# Patient Record
Sex: Female | Born: 1940 | Marital: Married | Smoking: Former smoker
Health system: Southern US, Community
[De-identification: ages and names within clinical notes are randomized; demographics above are authoritative.]

## PROBLEM LIST (undated history)

## (undated) DIAGNOSIS — K224 Dyskinesia of esophagus: Secondary | ICD-10-CM

## (undated) DIAGNOSIS — R7881 Bacteremia: Secondary | ICD-10-CM

## (undated) DIAGNOSIS — J189 Pneumonia, unspecified organism: Secondary | ICD-10-CM

## (undated) DIAGNOSIS — D649 Anemia, unspecified: Secondary | ICD-10-CM

## (undated) DIAGNOSIS — E119 Type 2 diabetes mellitus without complications: Secondary | ICD-10-CM

## (undated) DIAGNOSIS — T17908A Unspecified foreign body in respiratory tract, part unspecified causing other injury, initial encounter: Secondary | ICD-10-CM

## (undated) DIAGNOSIS — G8929 Other chronic pain: Secondary | ICD-10-CM

## (undated) DIAGNOSIS — J449 Chronic obstructive pulmonary disease, unspecified: Secondary | ICD-10-CM

## (undated) DIAGNOSIS — J9611 Chronic respiratory failure with hypoxia: Secondary | ICD-10-CM

## (undated) DIAGNOSIS — N179 Acute kidney failure, unspecified: Secondary | ICD-10-CM

## (undated) DIAGNOSIS — I10 Essential (primary) hypertension: Secondary | ICD-10-CM

---

## 2006-04-12 ENCOUNTER — Ambulatory Visit: Payer: Self-pay | Admitting: Family Medicine

## 2006-08-27 ENCOUNTER — Emergency Department: Payer: Self-pay | Admitting: Emergency Medicine

## 2007-05-24 ENCOUNTER — Inpatient Hospital Stay: Payer: Self-pay | Admitting: Internal Medicine

## 2007-05-24 ENCOUNTER — Other Ambulatory Visit: Payer: Self-pay

## 2008-08-02 ENCOUNTER — Emergency Department: Payer: Self-pay | Admitting: Emergency Medicine

## 2008-08-02 ENCOUNTER — Other Ambulatory Visit: Payer: Self-pay

## 2009-06-25 ENCOUNTER — Emergency Department: Payer: Self-pay | Admitting: Unknown Physician Specialty

## 2009-07-17 ENCOUNTER — Emergency Department: Payer: Self-pay | Admitting: Emergency Medicine

## 2009-08-01 DEATH — deceased

## 2009-09-27 ENCOUNTER — Emergency Department: Payer: Self-pay | Admitting: Internal Medicine

## 2010-02-07 ENCOUNTER — Emergency Department: Payer: Self-pay | Admitting: Emergency Medicine

## 2010-07-27 ENCOUNTER — Ambulatory Visit: Payer: Self-pay | Admitting: Family Medicine

## 2010-09-29 ENCOUNTER — Ambulatory Visit: Payer: Self-pay | Admitting: Orthopedic Surgery

## 2010-09-30 ENCOUNTER — Ambulatory Visit: Payer: Self-pay | Admitting: Orthopedic Surgery

## 2010-10-04 ENCOUNTER — Emergency Department: Payer: Self-pay | Admitting: Emergency Medicine

## 2010-10-24 ENCOUNTER — Emergency Department: Payer: Self-pay | Admitting: Emergency Medicine

## 2011-02-17 ENCOUNTER — Ambulatory Visit: Payer: Self-pay | Admitting: Family Medicine

## 2012-04-02 ENCOUNTER — Inpatient Hospital Stay: Payer: Self-pay | Admitting: Internal Medicine

## 2012-04-02 LAB — COMPREHENSIVE METABOLIC PANEL
Albumin: 4.8 g/dL (ref 3.4–5.0)
Anion Gap: 9 (ref 7–16)
BUN: 11 mg/dL (ref 7–18)
Calcium, Total: 10.3 mg/dL — ABNORMAL HIGH (ref 8.5–10.1)
Chloride: 101 mmol/L (ref 98–107)
Co2: 31 mmol/L (ref 21–32)
Creatinine: 0.89 mg/dL (ref 0.60–1.30)
EGFR (African American): >60
Glucose: 99 mg/dL (ref 65–99)
Osmolality: 281 (ref 275–301)
Potassium: 4.6 mmol/L (ref 3.5–5.1)
SGOT(AST): 31 U/L (ref 15–37)
Sodium: 141 mmol/L (ref 136–145)

## 2012-04-02 LAB — CBC WITH DIFFERENTIAL/PLATELET
Basophil #: 0.1 10*3/uL (ref 0.0–0.1)
Basophil %: 0.7 %
Eosinophil #: 0.6 10*3/uL (ref 0.0–0.7)
Eosinophil %: 4.5 %
HCT: 48.8 % — ABNORMAL HIGH (ref 35.0–47.0)
HGB: 17.1 g/dL — ABNORMAL HIGH (ref 12.0–16.0)
Lymphocyte #: 3.9 10*3/uL — ABNORMAL HIGH (ref 1.0–3.6)
Lymphocyte %: 29.6 %
MCH: 30.9 pg (ref 26.0–34.0)
MCHC: 35.1 g/dL (ref 32.0–36.0)
MCV: 88 fL (ref 80–100)
Monocyte #: 0.4 x10 3/mm (ref 0.2–0.9)
Monocyte %: 3.2 %
Neutrophil #: 8.1 10*3/uL — ABNORMAL HIGH (ref 1.4–6.5)
Neutrophil %: 62 %
Platelet: 236 10*3/uL (ref 150–440)
RBC: 5.54 10*6/uL — ABNORMAL HIGH (ref 3.80–5.20)
RDW: 13.3 % (ref 11.5–14.5)
WBC: 13 10*3/uL — ABNORMAL HIGH (ref 3.6–11.0)

## 2012-04-03 LAB — CBC WITH DIFFERENTIAL/PLATELET
Basophil #: 0 10*3/uL (ref 0.0–0.1)
Basophil %: 0.7 %
HCT: 39.9 % (ref 35.0–47.0)
HGB: 13.4 g/dL (ref 12.0–16.0)
Lymphocyte %: 38.5 %
MCH: 30 pg (ref 26.0–34.0)
MCHC: 33.7 g/dL (ref 32.0–36.0)
MCV: 89 fL (ref 80–100)
Monocyte #: 0.6 x10 3/mm (ref 0.2–0.9)
Neutrophil %: 46 %
RBC: 4.48 10*6/uL (ref 3.80–5.20)
WBC: 7.4 10*3/uL (ref 3.6–11.0)

## 2012-04-07 LAB — CULTURE, BLOOD (SINGLE)

## 2012-05-02 ENCOUNTER — Encounter: Payer: Self-pay | Admitting: Orthopedic Surgery

## 2012-06-01 ENCOUNTER — Encounter: Payer: Self-pay | Admitting: Orthopedic Surgery

## 2012-08-15 ENCOUNTER — Ambulatory Visit: Payer: Self-pay | Admitting: Family Medicine

## 2012-11-12 ENCOUNTER — Emergency Department: Payer: Self-pay | Admitting: Emergency Medicine

## 2012-11-12 LAB — URINALYSIS, COMPLETE
Blood: NEGATIVE
Glucose,UR: NEGATIVE mg/dL (ref 0–75)
Ketone: NEGATIVE
Nitrite: NEGATIVE
Ph: 6 (ref 4.5–8.0)
RBC,UR: 1 /HPF (ref 0–5)
Specific Gravity: 1.013 (ref 1.003–1.030)
WBC UR: 3 /HPF (ref 0–5)

## 2012-11-12 LAB — CBC WITH DIFFERENTIAL/PLATELET
Basophil #: 0.6 10*3/uL — ABNORMAL HIGH (ref 0.0–0.1)
Basophil %: 6 %
HCT: 38.5 % (ref 35.0–47.0)
HGB: 13.2 g/dL (ref 12.0–16.0)
Lymphocyte %: 9.9 %
MCV: 88 fL (ref 80–100)
Monocyte %: 2.6 %
Platelet: 226 10*3/uL (ref 150–440)
RBC: 4.39 10*6/uL (ref 3.80–5.20)

## 2012-11-12 LAB — COMPREHENSIVE METABOLIC PANEL
Albumin: 3.5 g/dL (ref 3.4–5.0)
Alkaline Phosphatase: 125 U/L (ref 50–136)
Anion Gap: 6 — ABNORMAL LOW (ref 7–16)
BUN: 14 mg/dL (ref 7–18)
Bilirubin,Total: 0.6 mg/dL (ref 0.2–1.0)
Chloride: 103 mmol/L (ref 98–107)
Co2: 31 mmol/L (ref 21–32)
Creatinine: 1.04 mg/dL (ref 0.60–1.30)
EGFR (African American): >60
EGFR (Non-African Amer.): 54 — ABNORMAL LOW
Osmolality: 280 (ref 275–301)
Potassium: 3.8 mmol/L (ref 3.5–5.1)
SGOT(AST): 22 U/L (ref 15–37)
Total Protein: 7.2 g/dL (ref 6.4–8.2)

## 2012-11-12 LAB — RAPID INFLUENZA A&B ANTIGENS

## 2012-12-14 LAB — CBC WITH DIFFERENTIAL/PLATELET
Basophil %: 0.6 %
Eosinophil #: 0.3 10*3/uL (ref 0.0–0.7)
Eosinophil %: 4.4 %
HCT: 40.4 % (ref 35.0–47.0)
Lymphocyte #: 1.4 10*3/uL (ref 1.0–3.6)
Lymphocyte %: 18.4 %
MCH: 30.4 pg (ref 26.0–34.0)
MCV: 89 fL (ref 80–100)
Monocyte %: 13.9 %
Neutrophil #: 4.7 10*3/uL (ref 1.4–6.5)
Platelet: 182 10*3/uL (ref 150–440)
RBC: 4.56 10*6/uL (ref 3.80–5.20)
RDW: 12.9 % (ref 11.5–14.5)
WBC: 7.5 10*3/uL (ref 3.6–11.0)

## 2012-12-14 LAB — COMPREHENSIVE METABOLIC PANEL
Albumin: 3.9 g/dL (ref 3.4–5.0)
Alkaline Phosphatase: 114 U/L (ref 50–136)
BUN: 18 mg/dL (ref 7–18)
Bilirubin,Total: 0.8 mg/dL (ref 0.2–1.0)
Calcium, Total: 9 mg/dL (ref 8.5–10.1)
Co2: 31 mmol/L (ref 21–32)
EGFR (African American): 38 — ABNORMAL LOW
Glucose: 114 mg/dL — ABNORMAL HIGH (ref 65–99)
Osmolality: 273 (ref 275–301)
SGOT(AST): 28 U/L (ref 15–37)
SGPT (ALT): 26 U/L (ref 12–78)
Sodium: 135 mmol/L — ABNORMAL LOW (ref 136–145)
Total Protein: 8 g/dL (ref 6.4–8.2)

## 2012-12-14 LAB — URINALYSIS, COMPLETE
Bilirubin,UR: NEGATIVE
Blood: NEGATIVE
Glucose,UR: NEGATIVE mg/dL (ref 0–75)
Hyaline Cast: 24
Ketone: NEGATIVE
Nitrite: NEGATIVE
Specific Gravity: 1.02 (ref 1.003–1.030)

## 2012-12-14 LAB — TROPONIN I: Troponin-I: <0.02 ng/mL

## 2012-12-14 LAB — TSH: Thyroid Stimulating Horm: 2.56 u[IU]/mL

## 2012-12-15 ENCOUNTER — Inpatient Hospital Stay: Payer: Self-pay | Admitting: Student

## 2012-12-16 LAB — CBC WITH DIFFERENTIAL/PLATELET
Basophil #: 0 10*3/uL (ref 0.0–0.1)
Basophil %: 0.2 %
Eosinophil #: 0 10*3/uL (ref 0.0–0.7)
HCT: 39.9 % (ref 35.0–47.0)
HGB: 13.8 g/dL (ref 12.0–16.0)
Lymphocyte %: 10.5 %
MCH: 30.4 pg (ref 26.0–34.0)
MCV: 88 fL (ref 80–100)
Monocyte #: 0.4 x10 3/mm (ref 0.2–0.9)
Neutrophil #: 12 10*3/uL — ABNORMAL HIGH (ref 1.4–6.5)
Neutrophil %: 86.3 %
Platelet: 201 10*3/uL (ref 150–440)
RBC: 4.55 10*6/uL (ref 3.80–5.20)
RDW: 12.6 % (ref 11.5–14.5)

## 2012-12-16 LAB — BASIC METABOLIC PANEL
BUN: 13 mg/dL (ref 7–18)
Co2: 26 mmol/L (ref 21–32)
Creatinine: 1.06 mg/dL (ref 0.60–1.30)
EGFR (Non-African Amer.): 53 — ABNORMAL LOW
Glucose: 276 mg/dL — ABNORMAL HIGH (ref 65–99)
Osmolality: 286 (ref 275–301)
Potassium: 3.9 mmol/L (ref 3.5–5.1)

## 2012-12-17 LAB — URINE CULTURE

## 2012-12-20 LAB — CBC WITH DIFFERENTIAL/PLATELET
Basophil #: 0 10*3/uL (ref 0.0–0.1)
Basophil %: 0.4 %
Eosinophil #: 0 10*3/uL (ref 0.0–0.7)
Eosinophil %: 0 %
HGB: 13.7 g/dL (ref 12.0–16.0)
MCH: 30.2 pg (ref 26.0–34.0)
MCHC: 34.7 g/dL (ref 32.0–36.0)
MCV: 87 fL (ref 80–100)
Monocyte #: 0.6 x10 3/mm (ref 0.2–0.9)
Neutrophil #: 8.8 10*3/uL — ABNORMAL HIGH (ref 1.4–6.5)
Neutrophil %: 80 %
Platelet: 222 10*3/uL (ref 150–440)
WBC: 11 10*3/uL (ref 3.6–11.0)

## 2012-12-20 LAB — EXPECTORATED SPUTUM ASSESSMENT W GRAM STAIN, RFLX TO RESP C

## 2012-12-20 LAB — CULTURE, BLOOD (SINGLE)

## 2012-12-27 LAB — BASIC METABOLIC PANEL
Anion Gap: 11 (ref 7–16)
BUN: 26 mg/dL — ABNORMAL HIGH (ref 7–18)
Chloride: 92 mmol/L — ABNORMAL LOW (ref 98–107)
EGFR (African American): >60
EGFR (Non-African Amer.): >60
Glucose: 431 mg/dL — ABNORMAL HIGH (ref 65–99)
Potassium: 4.9 mmol/L (ref 3.5–5.1)

## 2012-12-27 LAB — PLATELET COUNT: Platelet: 266 10*3/uL (ref 150–440)

## 2012-12-28 LAB — BASIC METABOLIC PANEL
Anion Gap: 6 — ABNORMAL LOW (ref 7–16)
BUN: 24 mg/dL — ABNORMAL HIGH (ref 7–18)
Calcium, Total: 8.7 mg/dL (ref 8.5–10.1)
Chloride: 92 mmol/L — ABNORMAL LOW (ref 98–107)
Co2: 30 mmol/L (ref 21–32)
Creatinine: 0.77 mg/dL (ref 0.60–1.30)
EGFR (African American): >60
Osmolality: 280 (ref 275–301)
Potassium: 4.9 mmol/L (ref 3.5–5.1)
Sodium: 128 mmol/L — ABNORMAL LOW (ref 136–145)

## 2012-12-29 LAB — BASIC METABOLIC PANEL
Anion Gap: 8 (ref 7–16)
Creatinine: 0.77 mg/dL (ref 0.60–1.30)
EGFR (African American): >60
Glucose: 299 mg/dL — ABNORMAL HIGH (ref 65–99)
Osmolality: 277 (ref 275–301)
Potassium: 4.4 mmol/L (ref 3.5–5.1)

## 2012-12-30 ENCOUNTER — Ambulatory Visit: Payer: Self-pay | Admitting: Internal Medicine

## 2013-01-07 ENCOUNTER — Inpatient Hospital Stay: Payer: Self-pay | Admitting: Internal Medicine

## 2013-01-07 LAB — CBC WITH DIFFERENTIAL/PLATELET
Basophil %: 1.1 %
Eosinophil #: 0.2 10*3/uL (ref 0.0–0.7)
Eosinophil %: 1.2 %
HCT: 32 % — ABNORMAL LOW (ref 35.0–47.0)
HGB: 10.6 g/dL — ABNORMAL LOW (ref 12.0–16.0)
Lymphocyte %: 3.7 %
MCH: 29 pg (ref 26.0–34.0)
MCHC: 33 g/dL (ref 32.0–36.0)
MCV: 88 fL (ref 80–100)
Monocyte %: 3.3 %
Neutrophil %: 90.7 %
WBC: 12.9 10*3/uL — ABNORMAL HIGH (ref 3.6–11.0)

## 2013-01-07 LAB — COMPREHENSIVE METABOLIC PANEL
Alkaline Phosphatase: 112 U/L (ref 50–136)
Anion Gap: 10 (ref 7–16)
Calcium, Total: 8.2 mg/dL — ABNORMAL LOW (ref 8.5–10.1)
Creatinine: 0.73 mg/dL (ref 0.60–1.30)
EGFR (African American): >60
EGFR (Non-African Amer.): >60
Osmolality: 266 (ref 275–301)
Potassium: 3.2 mmol/L — ABNORMAL LOW (ref 3.5–5.1)
SGOT(AST): 17 U/L (ref 15–37)
SGPT (ALT): 29 U/L (ref 12–78)
Sodium: 132 mmol/L — ABNORMAL LOW (ref 136–145)
Total Protein: 6.3 g/dL — ABNORMAL LOW (ref 6.4–8.2)

## 2013-01-07 LAB — CK-MB: CK-MB: 0.9 ng/mL (ref 0.5–3.6)

## 2013-01-07 LAB — CK TOTAL AND CKMB (NOT AT ARMC): CK-MB: 3.5 ng/mL (ref 0.5–3.6)

## 2013-01-07 LAB — TROPONIN I: Troponin-I: 0.12 ng/mL — ABNORMAL HIGH

## 2013-01-08 DIAGNOSIS — R079 Chest pain, unspecified: Secondary | ICD-10-CM

## 2013-01-08 LAB — CBC WITH DIFFERENTIAL/PLATELET
Basophil #: 0 x10 3/mm 3 (ref 0.0–0.1)
Basophil %: 0.3 %
Eosinophil #: 0 x10 3/mm 3 (ref 0.0–0.7)
Eosinophil %: 0 %
HCT: 26 % — ABNORMAL LOW (ref 35.0–47.0)
HGB: 8.7 g/dL — ABNORMAL LOW (ref 12.0–16.0)
Lymphocyte %: 3.5 %
Lymphs Abs: 0.4 x10 3/mm 3 — ABNORMAL LOW (ref 1.0–3.6)
MCH: 29.9 pg (ref 26.0–34.0)
MCHC: 33.5 g/dL (ref 32.0–36.0)
MCV: 89 fL (ref 80–100)
Monocyte #: 0.2 x10 3/mm (ref 0.2–0.9)
Monocyte %: 1.6 %
Neutrophil #: 10.7 x10 3/mm 3 — ABNORMAL HIGH (ref 1.4–6.5)
Neutrophil %: 94.6 %
Platelet: 158 x10 3/mm 3 (ref 150–440)
RBC: 2.92 X10 6/mm 3 — ABNORMAL LOW (ref 3.80–5.20)
RDW: 13.3 % (ref 11.5–14.5)
WBC: 11.3 x10 3/mm 3 — ABNORMAL HIGH (ref 3.6–11.0)

## 2013-01-08 LAB — BASIC METABOLIC PANEL WITH GFR
Anion Gap: 10 (ref 7–16)
BUN: 7 mg/dL (ref 7–18)
Calcium, Total: 7.8 mg/dL — ABNORMAL LOW (ref 8.5–10.1)
Chloride: 98 mmol/L (ref 98–107)
Co2: 25 mmol/L (ref 21–32)
Creatinine: 0.88 mg/dL (ref 0.60–1.30)
EGFR (African American): >60
EGFR (Non-African Amer.): >60
Glucose: 267 mg/dL — ABNORMAL HIGH (ref 65–99)
Osmolality: 274 (ref 275–301)
Potassium: 2.8 mmol/L — ABNORMAL LOW (ref 3.5–5.1)
Sodium: 133 mmol/L — ABNORMAL LOW (ref 136–145)

## 2013-01-08 LAB — MAGNESIUM
Magnesium: 1.2 mg/dL — ABNORMAL LOW
Magnesium: 2 mg/dL

## 2013-01-08 LAB — POTASSIUM: Potassium: 4.1 mmol/L (ref 3.5–5.1)

## 2013-01-08 LAB — TROPONIN I
Troponin-I: 0.03 ng/mL
Troponin-I: 0.48 ng/mL — ABNORMAL HIGH

## 2013-01-08 LAB — CK TOTAL AND CKMB (NOT AT ARMC): CK, Total: 477 U/L — ABNORMAL HIGH (ref 21–215)

## 2013-01-09 LAB — CBC WITH DIFFERENTIAL/PLATELET
Basophil #: 0 10*3/uL (ref 0.0–0.1)
Eosinophil #: 0 10*3/uL (ref 0.0–0.7)
HGB: 8.9 g/dL — ABNORMAL LOW (ref 12.0–16.0)
Lymphocyte %: 3.5 %
MCH: 30.7 pg (ref 26.0–34.0)
MCV: 89 fL (ref 80–100)
Monocyte #: 0.3 x10 3/mm (ref 0.2–0.9)
Monocyte %: 2.6 %
Neutrophil #: 9.3 10*3/uL — ABNORMAL HIGH (ref 1.4–6.5)
Platelet: 200 10*3/uL (ref 150–440)
RBC: 2.91 10*6/uL — ABNORMAL LOW (ref 3.80–5.20)
WBC: 10 10*3/uL (ref 3.6–11.0)

## 2013-01-09 LAB — BASIC METABOLIC PANEL
Anion Gap: 3 — ABNORMAL LOW (ref 7–16)
BUN: 9 mg/dL (ref 7–18)
Calcium, Total: 8.3 mg/dL — ABNORMAL LOW (ref 8.5–10.1)
Chloride: 106 mmol/L (ref 98–107)
Co2: 29 mmol/L (ref 21–32)
Creatinine: 0.57 mg/dL — ABNORMAL LOW (ref 0.60–1.30)
EGFR (African American): >60
EGFR (Non-African Amer.): >60
Potassium: 3.9 mmol/L (ref 3.5–5.1)
Sodium: 138 mmol/L (ref 136–145)

## 2013-01-09 LAB — TROPONIN I: Troponin-I: 0.64 ng/mL — ABNORMAL HIGH

## 2013-01-09 LAB — VANCOMYCIN, TROUGH: Vancomycin, Trough: 7 ug/mL — ABNORMAL LOW (ref 10–20)

## 2013-01-09 LAB — MAGNESIUM: Magnesium: 1.8 mg/dL

## 2013-01-09 LAB — T4, FREE: Free Thyroxine: 1.46 ng/dL (ref 0.76–1.46)

## 2013-01-10 DIAGNOSIS — I499 Cardiac arrhythmia, unspecified: Secondary | ICD-10-CM

## 2013-01-12 LAB — CULTURE, BLOOD (SINGLE)

## 2013-01-14 LAB — PLATELET COUNT: Platelet: 434 10*3/uL (ref 150–440)

## 2013-01-15 LAB — CBC WITH DIFFERENTIAL/PLATELET
Bands: 1 %
HGB: 10.4 g/dL — ABNORMAL LOW (ref 12.0–16.0)
Lymphocytes: 13 %
MCH: 29.8 pg (ref 26.0–34.0)
MCV: 89 fL (ref 80–100)
Metamyelocyte: 1 %
Monocytes: 1 %
RDW: 13.6 % (ref 11.5–14.5)
Segmented Neutrophils: 84 %
WBC: 8.1 10*3/uL (ref 3.6–11.0)

## 2013-01-16 ENCOUNTER — Inpatient Hospital Stay
Admission: AD | Admit: 2013-01-16 | Discharge: 2013-04-01 | Disposition: E | Payer: No Typology Code available for payment source | Source: Ambulatory Visit | Attending: Internal Medicine | Admitting: Internal Medicine

## 2013-01-16 DIAGNOSIS — A499 Bacterial infection, unspecified: Secondary | ICD-10-CM

## 2013-01-16 DIAGNOSIS — J90 Pleural effusion, not elsewhere classified: Secondary | ICD-10-CM

## 2013-01-16 DIAGNOSIS — J9600 Acute respiratory failure, unspecified whether with hypoxia or hypercapnia: Secondary | ICD-10-CM | POA: Diagnosis present

## 2013-01-16 DIAGNOSIS — J449 Chronic obstructive pulmonary disease, unspecified: Secondary | ICD-10-CM | POA: Diagnosis present

## 2013-01-16 DIAGNOSIS — A498 Other bacterial infections of unspecified site: Secondary | ICD-10-CM | POA: Diagnosis not present

## 2013-01-16 DIAGNOSIS — R6521 Severe sepsis with septic shock: Secondary | ICD-10-CM | POA: Diagnosis present

## 2013-01-16 DIAGNOSIS — J95851 Ventilator associated pneumonia: Secondary | ICD-10-CM

## 2013-01-16 DIAGNOSIS — R0902 Hypoxemia: Secondary | ICD-10-CM | POA: Diagnosis present

## 2013-01-16 DIAGNOSIS — R1310 Dysphagia, unspecified: Secondary | ICD-10-CM | POA: Diagnosis present

## 2013-01-16 DIAGNOSIS — B3749 Other urogenital candidiasis: Secondary | ICD-10-CM

## 2013-01-16 DIAGNOSIS — J810 Acute pulmonary edema: Secondary | ICD-10-CM | POA: Diagnosis present

## 2013-01-16 DIAGNOSIS — A4159 Other Gram-negative sepsis: Secondary | ICD-10-CM | POA: Diagnosis not present

## 2013-01-16 DIAGNOSIS — A419 Sepsis, unspecified organism: Secondary | ICD-10-CM | POA: Diagnosis present

## 2013-01-16 HISTORY — DX: Chronic obstructive pulmonary disease, unspecified: J44.9

## 2013-01-16 HISTORY — DX: Unspecified foreign body in respiratory tract, part unspecified causing other injury, initial encounter: T17.908A

## 2013-01-16 HISTORY — DX: Bacteremia: R78.81

## 2013-01-16 HISTORY — DX: Chronic respiratory failure with hypoxia: J96.11

## 2013-01-16 HISTORY — DX: Acute kidney failure, unspecified: N17.9

## 2013-01-16 HISTORY — DX: Other chronic pain: G89.29

## 2013-01-16 HISTORY — DX: Dyskinesia of esophagus: K22.4

## 2013-01-16 HISTORY — DX: Type 2 diabetes mellitus without complications: E11.9

## 2013-01-16 HISTORY — DX: Anemia, unspecified: D64.9

## 2013-01-16 HISTORY — DX: Pneumonia, unspecified organism: J18.9

## 2013-01-16 HISTORY — DX: Essential (primary) hypertension: I10

## 2013-01-17 ENCOUNTER — Other Ambulatory Visit (HOSPITAL_COMMUNITY): Payer: Self-pay

## 2013-01-17 LAB — COMPREHENSIVE METABOLIC PANEL
Albumin: 2.6 g/dL — ABNORMAL LOW (ref 3.5–5.2)
BUN: 20 mg/dL (ref 6–23)
Calcium: 8.5 mg/dL (ref 8.4–10.5)
Creatinine, Ser: 0.5 mg/dL (ref 0.50–1.10)
GFR calc Af Amer: >90 mL/min (ref >90)
Glucose, Bld: 130 mg/dL — ABNORMAL HIGH (ref 70–99)
Potassium: 3.7 mEq/L (ref 3.5–5.1)
Total Protein: 5.1 g/dL — ABNORMAL LOW (ref 6.0–8.3)

## 2013-01-17 LAB — PRO B NATRIURETIC PEPTIDE: Pro B Natriuretic peptide (BNP): 513.6 pg/mL — ABNORMAL HIGH (ref 0–125)

## 2013-01-17 LAB — PROCALCITONIN: Procalcitonin: <0.1 ng/mL

## 2013-01-17 LAB — CBC
HCT: 29.2 % — ABNORMAL LOW (ref 36.0–46.0)
Hemoglobin: 10.2 g/dL — ABNORMAL LOW (ref 12.0–15.0)
MCH: 30.4 pg (ref 26.0–34.0)
MCHC: 34.9 g/dL (ref 30.0–36.0)
RDW: 14.4 % (ref 11.5–15.5)

## 2013-01-17 LAB — PREALBUMIN: Prealbumin: 32.9 mg/dL (ref 17.0–34.0)

## 2013-01-17 LAB — SEDIMENTATION RATE: Sed Rate: 17 mm/hr (ref 0–22)

## 2013-01-18 LAB — BASIC METABOLIC PANEL
BUN: 18 mg/dL (ref 6–23)
Chloride: 97 mEq/L (ref 96–112)
GFR calc Af Amer: >90 mL/min (ref >90)
GFR calc non Af Amer: >90 mL/min (ref >90)
Potassium: 4 mEq/L (ref 3.5–5.1)

## 2013-01-18 LAB — CBC
HCT: 28.9 % — ABNORMAL LOW (ref 36.0–46.0)
MCHC: 36.3 g/dL — ABNORMAL HIGH (ref 30.0–36.0)
Platelets: 396 10*3/uL (ref 150–400)
RDW: 14.8 % (ref 11.5–15.5)
WBC: 11.7 10*3/uL — ABNORMAL HIGH (ref 4.0–10.5)

## 2013-01-19 ENCOUNTER — Other Ambulatory Visit (HOSPITAL_COMMUNITY): Payer: No Typology Code available for payment source

## 2013-01-20 LAB — CBC
MCV: 86 fL (ref 78.0–100.0)
Platelets: 331 10*3/uL (ref 150–400)
RBC: 3.44 MIL/uL — ABNORMAL LOW (ref 3.87–5.11)
WBC: 10.8 10*3/uL — ABNORMAL HIGH (ref 4.0–10.5)

## 2013-01-20 LAB — RENAL FUNCTION PANEL
CO2: 26 mEq/L (ref 19–32)
Chloride: 95 mEq/L — ABNORMAL LOW (ref 96–112)
Creatinine, Ser: 0.52 mg/dL (ref 0.50–1.10)
GFR calc Af Amer: >90 mL/min (ref >90)
GFR calc non Af Amer: >90 mL/min (ref >90)
Potassium: 4.2 mEq/L (ref 3.5–5.1)
Sodium: 134 mEq/L — ABNORMAL LOW (ref 135–145)

## 2013-01-22 ENCOUNTER — Other Ambulatory Visit (HOSPITAL_COMMUNITY): Payer: Self-pay

## 2013-01-22 LAB — CBC WITH DIFFERENTIAL/PLATELET
Eosinophils Absolute: 0 10*3/uL (ref 0.0–0.7)
Eosinophils Relative: 0 % (ref 0–5)
Hemoglobin: 10.9 g/dL — ABNORMAL LOW (ref 12.0–15.0)
Lymphocytes Relative: 6 % — ABNORMAL LOW (ref 12–46)
Lymphs Abs: 0.6 10*3/uL — ABNORMAL LOW (ref 0.7–4.0)
MCH: 30.8 pg (ref 26.0–34.0)
MCV: 89.3 fL (ref 78.0–100.0)
Monocytes Relative: 5 % (ref 3–12)
Neutrophils Relative %: 89 % — ABNORMAL HIGH (ref 43–77)
RBC: 3.54 MIL/uL — ABNORMAL LOW (ref 3.87–5.11)
WBC: 10.6 10*3/uL — ABNORMAL HIGH (ref 4.0–10.5)

## 2013-01-22 LAB — BASIC METABOLIC PANEL
CO2: 30 mEq/L (ref 19–32)
Glucose, Bld: 120 mg/dL — ABNORMAL HIGH (ref 70–99)
Potassium: 3.8 mEq/L (ref 3.5–5.1)
Sodium: 137 mEq/L (ref 135–145)

## 2013-01-22 LAB — CBC
Hemoglobin: 10.6 g/dL — ABNORMAL LOW (ref 12.0–15.0)
Platelets: 254 10*3/uL (ref 150–400)
RBC: 3.42 MIL/uL — ABNORMAL LOW (ref 3.87–5.11)
WBC: 13.8 10*3/uL — ABNORMAL HIGH (ref 4.0–10.5)

## 2013-01-23 ENCOUNTER — Other Ambulatory Visit (HOSPITAL_COMMUNITY): Payer: Self-pay

## 2013-01-23 LAB — EXPECTORATED SPUTUM ASSESSMENT W GRAM STAIN, RFLX TO RESP C: Special Requests: NORMAL

## 2013-01-24 ENCOUNTER — Other Ambulatory Visit (HOSPITAL_COMMUNITY): Payer: Self-pay

## 2013-01-24 LAB — BASIC METABOLIC PANEL
BUN: 12 mg/dL (ref 6–23)
Calcium: 9.1 mg/dL (ref 8.4–10.5)
Creatinine, Ser: 0.45 mg/dL — ABNORMAL LOW (ref 0.50–1.10)
GFR calc Af Amer: >90 mL/min (ref >90)
GFR calc non Af Amer: >90 mL/min (ref >90)
Glucose, Bld: 103 mg/dL — ABNORMAL HIGH (ref 70–99)
Potassium: 3.7 mEq/L (ref 3.5–5.1)

## 2013-01-24 LAB — CBC WITH DIFFERENTIAL/PLATELET
Basophils Relative: 0 % (ref 0–1)
Eosinophils Absolute: 0 10*3/uL (ref 0.0–0.7)
Eosinophils Relative: 0 % (ref 0–5)
Hemoglobin: 11.2 g/dL — ABNORMAL LOW (ref 12.0–15.0)
Lymphs Abs: 1.4 10*3/uL (ref 0.7–4.0)
MCH: 30.9 pg (ref 26.0–34.0)
MCHC: 34 g/dL (ref 30.0–36.0)
MCV: 90.9 fL (ref 78.0–100.0)
Monocytes Absolute: 0.7 10*3/uL (ref 0.1–1.0)
Monocytes Relative: 7 % (ref 3–12)
Neutrophils Relative %: 79 % — ABNORMAL HIGH (ref 43–77)
RBC: 3.62 MIL/uL — ABNORMAL LOW (ref 3.87–5.11)

## 2013-01-25 LAB — CULTURE, RESPIRATORY W GRAM STAIN

## 2013-01-25 LAB — CBC WITH DIFFERENTIAL/PLATELET
Basophils Absolute: 0 10*3/uL (ref 0.0–0.1)
Basophils Relative: 0 % (ref 0–1)
Eosinophils Absolute: 0 10*3/uL (ref 0.0–0.7)
Hemoglobin: 10.5 g/dL — ABNORMAL LOW (ref 12.0–15.0)
MCH: 30.8 pg (ref 26.0–34.0)
MCHC: 34.7 g/dL (ref 30.0–36.0)
Monocytes Relative: 9 % (ref 3–12)
Neutro Abs: 4.8 10*3/uL (ref 1.7–7.7)
Neutrophils Relative %: 72 % (ref 43–77)
RDW: 16.1 % — ABNORMAL HIGH (ref 11.5–15.5)

## 2013-01-25 LAB — BASIC METABOLIC PANEL
BUN: 13 mg/dL (ref 6–23)
Chloride: 99 mEq/L (ref 96–112)
Creatinine, Ser: 0.47 mg/dL — ABNORMAL LOW (ref 0.50–1.10)
GFR calc Af Amer: >90 mL/min (ref >90)
GFR calc non Af Amer: >90 mL/min (ref >90)
Potassium: 3.9 mEq/L (ref 3.5–5.1)

## 2013-01-25 NOTE — H&P (Signed)
Elizabeth Maddox is an 72 y.o. female.   Chief Complaint: Dysphagia Using tube feeds now Deconditioning Admitted for pain control (spinal stenosis) and COPD Scheduled for percutaneous gastric tube placement for nutrition HPI: COPD; DM; CAD; HTN; spinal stenosis; dysphagia  No past medical history on file.  No past surgical history on file.  No family history on file. Social History:  has no tobacco, alcohol, and drug history on file.  Allergies: Not on File  No prescriptions prior to admission    Results for orders placed during the hospital encounter of 01/23/2013 (from the past 48 hour(s))  CULTURE, EXPECTORATED SPUTUM-ASSESSMENT     Status: None   Collection Time    01/23/13 10:22 AM      Result Value Range   Specimen Description SPUTUM     Special Requests Normal     Sputum evaluation       Value: THIS SPECIMEN IS ACCEPTABLE. RESPIRATORY CULTURE REPORT TO FOLLOW.   Report Status 01/23/2013 FINAL    CULTURE, RESPIRATORY (NON-EXPECTORATED)     Status: None   Collection Time    01/23/13 10:22 AM      Result Value Range   Specimen Description SPUTUM     Special Requests NONE     Gram Stain       Value: FEW WBC PRESENT,BOTH PMN AND MONONUCLEAR     FEW SQUAMOUS EPITHELIAL CELLS PRESENT     MODERATE GRAM NEGATIVE RODS   Culture ABUNDANT ESCHERICHIA COLI     Report Status 01/25/2013 FINAL     Organism ID, Bacteria ESCHERICHIA COLI    CBC WITH DIFFERENTIAL     Status: Abnormal   Collection Time    01/24/13 11:30 AM      Result Value Range   WBC 10.1  4.0 - 10.5 K/uL   RBC 3.62 (*) 3.87 - 5.11 MIL/uL   Hemoglobin 11.2 (*) 12.0 - 15.0 g/dL   HCT 14.7 (*) 82.9 - 56.2 %   MCV 90.9  78.0 - 100.0 fL   MCH 30.9  26.0 - 34.0 pg   MCHC 34.0  30.0 - 36.0 g/dL   RDW 13.0 (*) 86.5 - 78.4 %   Platelets 190  150 - 400 K/uL   Neutrophils Relative 79 (*) 43 - 77 %   Neutro Abs 7.9 (*) 1.7 - 7.7 K/uL   Lymphocytes Relative 14  12 - 46 %   Lymphs Abs 1.4  0.7 - 4.0 K/uL   Monocytes Relative 7  3 - 12 %   Monocytes Absolute 0.7  0.1 - 1.0 K/uL   Eosinophils Relative 0  0 - 5 %   Eosinophils Absolute 0.0  0.0 - 0.7 K/uL   Basophils Relative 0  0 - 1 %   Basophils Absolute 0.0  0.0 - 0.1 K/uL  BASIC METABOLIC PANEL     Status: Abnormal   Collection Time    01/24/13 11:30 AM      Result Value Range   Sodium 142  135 - 145 mEq/L   Potassium 3.7  3.5 - 5.1 mEq/L   Chloride 102  96 - 112 mEq/L   CO2 31  19 - 32 mEq/L   Glucose, Bld 103 (*) 70 - 99 mg/dL   BUN 12  6 - 23 mg/dL   Creatinine, Ser 6.96 (*) 0.50 - 1.10 mg/dL   Calcium 9.1  8.4 - 29.5 mg/dL   GFR calc non Af Amer >90  >90 mL/min   GFR calc  Af Amer >90  >90 mL/min   Comment:            The eGFR has been calculated     using the CKD EPI equation.     This calculation has not been     validated in all clinical     situations.     eGFR's persistently     <90 mL/min signify     possible Chronic Kidney Disease.  CBC WITH DIFFERENTIAL     Status: Abnormal   Collection Time    01/25/13  5:40 AM      Result Value Range   WBC 6.6  4.0 - 10.5 K/uL   RBC 3.41 (*) 3.87 - 5.11 MIL/uL   Hemoglobin 10.5 (*) 12.0 - 15.0 g/dL   HCT 28.4 (*) 13.2 - 44.0 %   MCV 88.9  78.0 - 100.0 fL   MCH 30.8  26.0 - 34.0 pg   MCHC 34.7  30.0 - 36.0 g/dL   RDW 10.2 (*) 72.5 - 36.6 %   Platelets 156  150 - 400 K/uL   Neutrophils Relative 72  43 - 77 %   Neutro Abs 4.8  1.7 - 7.7 K/uL   Lymphocytes Relative 18  12 - 46 %   Lymphs Abs 1.2  0.7 - 4.0 K/uL   Monocytes Relative 9  3 - 12 %   Monocytes Absolute 0.6  0.1 - 1.0 K/uL   Eosinophils Relative 0  0 - 5 %   Eosinophils Absolute 0.0  0.0 - 0.7 K/uL   Basophils Relative 0  0 - 1 %   Basophils Absolute 0.0  0.0 - 0.1 K/uL  BASIC METABOLIC PANEL     Status: Abnormal   Collection Time    01/25/13  5:40 AM      Result Value Range   Sodium 138  135 - 145 mEq/L   Potassium 3.9  3.5 - 5.1 mEq/L   Chloride 99  96 - 112 mEq/L   CO2 32  19 - 32 mEq/L   Glucose, Bld  164 (*) 70 - 99 mg/dL   BUN 13  6 - 23 mg/dL   Creatinine, Ser 4.40 (*) 0.50 - 1.10 mg/dL   Calcium 8.9  8.4 - 34.7 mg/dL   GFR calc non Af Amer >90  >90 mL/min   GFR calc Af Amer >90  >90 mL/min   Comment:            The eGFR has been calculated     using the CKD EPI equation.     This calculation has not been     validated in all clinical     situations.     eGFR's persistently     <90 mL/min signify     possible Chronic Kidney Disease.   Dg Chest Port 1 View  01/24/2013  *RADIOLOGY REPORT*  Clinical Data: PICC placement.  PORTABLE CHEST - 1 VIEW  Comparison: 01/22/2013.  Findings: Right PICC tip projects over the SVC.  Nasogastric tube is followed into the stomach with the tip projecting beyond the inferior boundary of the image.  Lungs are somewhat low in volume with slight improvement in bibasilar air space disease.  No definite pleural fluid.  IMPRESSION: Slight improvement in bibasilar air space disease.   Original Report Authenticated By: Leanna Battles, M.D.    Dg Abd Portable 1v  01/23/2013  *RADIOLOGY REPORT*  Clinical Data: Feeding tube position  PORTABLE ABDOMEN - 1 VIEW  Comparison: Prior films same day  Findings: There is a NG feeding tube with tip in the proximal stomach.  Degenerative changes lumbar spine.  Dextroscoliosis lumbar spine.  Significant colonic stool.  IMPRESSION: Feeding tube with tip in proximal stomach.   Original Report Authenticated By: Natasha Mead, M.D.    Dg Abd Portable 1v  01/23/2013  *RADIOLOGY REPORT*  Clinical Data: NG tube placement  PORTABLE ABDOMEN - 1 VIEW  Comparison: Chest radiographs dated 01/22/2013  Findings: Patchy left lower lobe opacity, suspicious for pneumonia. Mild patchy right lower lobe opacity.  Enteric tube terminates in the distal esophagus with its side port in the mid esophagus.  Visualized upper abdomen is notable for residual contrast in the left colon.  IMPRESSION: Enteric tube terminates in the distal esophagus with its side  port in the mid esophagus.  Advancement is suggested.   Original Report Authenticated By: Charline Bills, M.D.     Review of Systems  Constitutional: Positive for weight loss. Negative for fever.  Respiratory: Negative for shortness of breath.   Cardiovascular: Negative for chest pain.  Gastrointestinal: Negative for nausea, vomiting and abdominal pain.  Musculoskeletal: Positive for back pain.  Neurological: Positive for weakness.    There were no vitals taken for this visit. Physical Exam  Constitutional: She is oriented to person, place, and time.  Cardiovascular: Normal rate, regular rhythm and normal heart sounds.   No murmur heard. Respiratory: Effort normal and breath sounds normal. She has no wheezes.  GI: Soft. Bowel sounds are normal. There is no tenderness.  Musculoskeletal:  In bed; weak and in pain  Neurological: She is alert and oriented to person, place, and time.  Psychiatric: She has a normal mood and affect. Her behavior is normal. Judgment and thought content normal.     Assessment/Plan Dysphagia; deconditioning Need gastric tube for nutrition scheduled for am pt aware of procedure benefits and risks and agreeable to proceed Consent to be gained by RN Pt on Piperacillan afeb Hep Inj held Barium ordered Check kub in am  Brittiny Levitz A 01/25/2013, 7:47 AM

## 2013-01-26 ENCOUNTER — Other Ambulatory Visit (HOSPITAL_COMMUNITY): Payer: Self-pay

## 2013-01-26 LAB — BASIC METABOLIC PANEL
CO2: 31 mEq/L (ref 19–32)
Calcium: 8.8 mg/dL (ref 8.4–10.5)
Glucose, Bld: 99 mg/dL (ref 70–99)
Sodium: 136 mEq/L (ref 135–145)

## 2013-01-26 LAB — CBC WITH DIFFERENTIAL/PLATELET
Eosinophils Relative: 0 % (ref 0–5)
HCT: 29.3 % — ABNORMAL LOW (ref 36.0–46.0)
Hemoglobin: 10.2 g/dL — ABNORMAL LOW (ref 12.0–15.0)
Lymphocytes Relative: 25 % (ref 12–46)
Lymphs Abs: 1.4 10*3/uL (ref 0.7–4.0)
MCV: 88.8 fL (ref 78.0–100.0)
Monocytes Absolute: 0.5 10*3/uL (ref 0.1–1.0)
Platelets: 136 10*3/uL — ABNORMAL LOW (ref 150–400)
RBC: 3.3 MIL/uL — ABNORMAL LOW (ref 3.87–5.11)
WBC: 5.5 10*3/uL (ref 4.0–10.5)

## 2013-01-26 LAB — PROTIME-INR: INR: 0.99 (ref 0.00–1.49)

## 2013-01-26 MED ORDER — IOHEXOL 300 MG/ML  SOLN
50.0000 mL | Freq: Once | INTRAMUSCULAR | Status: AC | PRN
  Administered 2013-01-26: 40 mL

## 2013-01-26 MED ORDER — GLUCAGON HCL (RDNA) 1 MG IJ SOLR
INTRAMUSCULAR | Status: AC | PRN
  Administered 2013-01-26: 1 mg via INTRAVENOUS

## 2013-01-26 MED ORDER — FENTANYL CITRATE 0.05 MG/ML IJ SOLN
INTRAMUSCULAR | Status: AC | PRN
  Administered 2013-01-26: 25 ug via INTRAVENOUS

## 2013-01-26 MED ORDER — MIDAZOLAM HCL 2 MG/2ML IJ SOLN
INTRAMUSCULAR | Status: AC | PRN
  Administered 2013-01-26: 2 mg via INTRAVENOUS

## 2013-01-26 NOTE — Procedures (Signed)
Successful fluoroscopic guided insertion of gastrostomy tube without immediate post procedural complicatoin.   The gastrostomy tube may be used immediately for medications.  Otherwise, place gastrostomy tube to low wall suction for 24 hrs.  Tube feeds may be initiated in 24 hours as per the primary team.   

## 2013-01-28 LAB — CBC WITH DIFFERENTIAL/PLATELET
Eosinophils Relative: 0 % (ref 0–5)
HCT: 30.7 % — ABNORMAL LOW (ref 36.0–46.0)
Hemoglobin: 10.7 g/dL — ABNORMAL LOW (ref 12.0–15.0)
Lymphocytes Relative: 34 % (ref 12–46)
Lymphs Abs: 2 10*3/uL (ref 0.7–4.0)
MCV: 89.5 fL (ref 78.0–100.0)
Monocytes Absolute: 0.3 10*3/uL (ref 0.1–1.0)
Monocytes Relative: 6 % (ref 3–12)
RBC: 3.43 MIL/uL — ABNORMAL LOW (ref 3.87–5.11)
WBC: 6 10*3/uL (ref 4.0–10.5)

## 2013-01-28 LAB — BASIC METABOLIC PANEL
CO2: 29 mEq/L (ref 19–32)
Chloride: 102 mEq/L (ref 96–112)
Creatinine, Ser: 0.49 mg/dL — ABNORMAL LOW (ref 0.50–1.10)

## 2013-01-29 ENCOUNTER — Other Ambulatory Visit (HOSPITAL_COMMUNITY): Payer: Self-pay

## 2013-01-29 LAB — URINALYSIS, ROUTINE W REFLEX MICROSCOPIC
Hgb urine dipstick: NEGATIVE
Protein, ur: NEGATIVE mg/dL
Urobilinogen, UA: 1 mg/dL (ref 0.0–1.0)

## 2013-01-29 LAB — BLOOD GAS, ARTERIAL
Acid-Base Excess: 3.7 mmol/L — ABNORMAL HIGH (ref 0.0–2.0)
Bicarbonate: 28.1 mEq/L — ABNORMAL HIGH (ref 20.0–24.0)
O2 Content: 6 L/min
TCO2: 29.5 mmol/L (ref 0–100)
pCO2 arterial: 46.1 mmHg — ABNORMAL HIGH (ref 35.0–45.0)

## 2013-01-29 LAB — BASIC METABOLIC PANEL
CO2: 30 mEq/L (ref 19–32)
Calcium: 8.9 mg/dL (ref 8.4–10.5)
Creatinine, Ser: 0.5 mg/dL (ref 0.50–1.10)
Glucose, Bld: 116 mg/dL — ABNORMAL HIGH (ref 70–99)

## 2013-01-29 LAB — CBC WITH DIFFERENTIAL/PLATELET
Basophils Absolute: 0 10*3/uL (ref 0.0–0.1)
Eosinophils Absolute: 0.1 10*3/uL (ref 0.0–0.7)
Eosinophils Relative: 1 % (ref 0–5)
HCT: 29.7 % — ABNORMAL LOW (ref 36.0–46.0)
Lymphs Abs: 2 10*3/uL (ref 0.7–4.0)
MCH: 31.4 pg (ref 26.0–34.0)
MCV: 91.4 fL (ref 78.0–100.0)
Monocytes Absolute: 0.3 10*3/uL (ref 0.1–1.0)
Platelets: 107 10*3/uL — ABNORMAL LOW (ref 150–400)
RDW: 16.4 % — ABNORMAL HIGH (ref 11.5–15.5)

## 2013-01-29 LAB — URINE MICROSCOPIC-ADD ON

## 2013-01-30 ENCOUNTER — Ambulatory Visit: Payer: Self-pay | Admitting: Internal Medicine

## 2013-01-30 LAB — BASIC METABOLIC PANEL
GFR calc Af Amer: >90 mL/min (ref >90)
GFR calc non Af Amer: >90 mL/min (ref >90)
Potassium: 3.5 mEq/L (ref 3.5–5.1)
Sodium: 138 mEq/L (ref 135–145)

## 2013-01-30 LAB — CBC WITH DIFFERENTIAL/PLATELET
Eosinophils Absolute: 0.1 10*3/uL (ref 0.0–0.7)
Eosinophils Relative: 1 % (ref 0–5)
HCT: 26.7 % — ABNORMAL LOW (ref 36.0–46.0)
Hemoglobin: 9.4 g/dL — ABNORMAL LOW (ref 12.0–15.0)
Lymphocytes Relative: 33 % (ref 12–46)
Lymphs Abs: 1.8 10*3/uL (ref 0.7–4.0)
MCH: 31.3 pg (ref 26.0–34.0)
MCV: 89 fL (ref 78.0–100.0)
Monocytes Absolute: 0.3 10*3/uL (ref 0.1–1.0)
Monocytes Relative: 6 % (ref 3–12)
Platelets: 96 10*3/uL — ABNORMAL LOW (ref 150–400)
RBC: 3 MIL/uL — ABNORMAL LOW (ref 3.87–5.11)
WBC: 5.6 10*3/uL (ref 4.0–10.5)

## 2013-01-30 LAB — MAGNESIUM: Magnesium: 1.6 mg/dL (ref 1.5–2.5)

## 2013-01-30 LAB — EXPECTORATED SPUTUM ASSESSMENT W GRAM STAIN, RFLX TO RESP C

## 2013-01-30 DEATH — deceased

## 2013-01-31 LAB — URINE CULTURE: Culture: NO GROWTH

## 2013-02-01 LAB — CREATININE, SERUM: Creatinine, Ser: 0.39 mg/dL — ABNORMAL LOW (ref 0.50–1.10)

## 2013-02-01 LAB — CULTURE, RESPIRATORY W GRAM STAIN: Culture: NORMAL

## 2013-02-02 LAB — BASIC METABOLIC PANEL
BUN: 6 mg/dL (ref 6–23)
CO2: 28 mEq/L (ref 19–32)
Chloride: 102 mEq/L (ref 96–112)
Creatinine, Ser: 0.38 mg/dL — ABNORMAL LOW (ref 0.50–1.10)
Glucose, Bld: 111 mg/dL — ABNORMAL HIGH (ref 70–99)

## 2013-02-02 LAB — CBC
HCT: 25.2 % — ABNORMAL LOW (ref 36.0–46.0)
Hemoglobin: 8.7 g/dL — ABNORMAL LOW (ref 12.0–15.0)
MCV: 90 fL (ref 78.0–100.0)
RBC: 2.8 MIL/uL — ABNORMAL LOW (ref 3.87–5.11)
RDW: 17.3 % — ABNORMAL HIGH (ref 11.5–15.5)
WBC: 4.5 10*3/uL (ref 4.0–10.5)

## 2013-02-04 LAB — BASIC METABOLIC PANEL
BUN: 15 mg/dL (ref 6–23)
CO2: 27 mEq/L (ref 19–32)
Calcium: 8.8 mg/dL (ref 8.4–10.5)
Chloride: 103 mEq/L (ref 96–112)
Creatinine, Ser: 1.1 mg/dL (ref 0.50–1.10)
GFR calc Af Amer: 58 mL/min — ABNORMAL LOW (ref >90)
GFR calc non Af Amer: 50 mL/min — ABNORMAL LOW (ref >90)
Glucose, Bld: 122 mg/dL — ABNORMAL HIGH (ref 70–99)
Potassium: 4.3 mEq/L (ref 3.5–5.1)
Sodium: 136 mEq/L (ref 135–145)

## 2013-02-04 LAB — CBC
HCT: 27.1 % — ABNORMAL LOW (ref 36.0–46.0)
MCHC: 33.6 g/dL (ref 30.0–36.0)
MCV: 91.2 fL (ref 78.0–100.0)
RDW: 17.6 % — ABNORMAL HIGH (ref 11.5–15.5)

## 2013-02-04 LAB — CULTURE, BLOOD (ROUTINE X 2): Culture: NO GROWTH

## 2013-02-04 LAB — VANCOMYCIN, TROUGH: Vancomycin Tr: 20.2 ug/mL — ABNORMAL HIGH (ref 10.0–20.0)

## 2013-02-05 LAB — URINALYSIS, ROUTINE W REFLEX MICROSCOPIC
Bilirubin Urine: NEGATIVE
Glucose, UA: NEGATIVE mg/dL
Hgb urine dipstick: NEGATIVE
Protein, ur: NEGATIVE mg/dL
Specific Gravity, Urine: 1.018 (ref 1.005–1.030)
Urobilinogen, UA: 0.2 mg/dL (ref 0.0–1.0)

## 2013-02-06 LAB — URINE CULTURE
Colony Count: NO GROWTH
Culture: NO GROWTH
Special Requests: NORMAL

## 2013-02-06 LAB — CK TOTAL AND CKMB (NOT AT ARMC)
CK, MB: 2 ng/mL (ref 0.3–4.0)
Relative Index: INVALID (ref 0.0–2.5)

## 2013-02-06 LAB — BASIC METABOLIC PANEL
BUN: 26 mg/dL — ABNORMAL HIGH (ref 6–23)
Chloride: 103 mEq/L (ref 96–112)
GFR calc Af Amer: 51 mL/min — ABNORMAL LOW (ref >90)
Potassium: 4.4 mEq/L (ref 3.5–5.1)

## 2013-02-06 LAB — CBC
MCHC: 33.7 g/dL (ref 30.0–36.0)
MCV: 91 fL (ref 78.0–100.0)
Platelets: 290 10*3/uL (ref 150–400)
RDW: 17 % — ABNORMAL HIGH (ref 11.5–15.5)
WBC: 6.9 10*3/uL (ref 4.0–10.5)

## 2013-02-06 LAB — MAGNESIUM: Magnesium: 1.9 mg/dL (ref 1.5–2.5)

## 2013-02-06 LAB — CK: Total CK: 28 U/L (ref 7–177)

## 2013-02-06 LAB — TROPONIN I: Troponin I: <0.3 ng/mL (ref <0.30)

## 2013-02-07 ENCOUNTER — Other Ambulatory Visit (HOSPITAL_COMMUNITY): Payer: Self-pay

## 2013-02-07 LAB — CK TOTAL AND CKMB (NOT AT ARMC)
CK, MB: 1.9 ng/mL (ref 0.3–4.0)
Relative Index: INVALID (ref 0.0–2.5)
Total CK: 25 U/L (ref 7–177)

## 2013-02-07 LAB — BLOOD GAS, ARTERIAL
Acid-Base Excess: 2.6 mmol/L — ABNORMAL HIGH (ref 0.0–2.0)
Bicarbonate: 27.1 mEq/L — ABNORMAL HIGH (ref 20.0–24.0)
FIO2: 50 %
O2 Saturation: 89.2 %
Patient temperature: 100.8

## 2013-02-07 LAB — BASIC METABOLIC PANEL
Calcium: 8.8 mg/dL (ref 8.4–10.5)
Creatinine, Ser: 1.15 mg/dL — ABNORMAL HIGH (ref 0.50–1.10)
GFR calc Af Amer: 55 mL/min — ABNORMAL LOW (ref >90)

## 2013-02-07 LAB — CBC
Platelets: 328 10*3/uL (ref 150–400)
RDW: 17 % — ABNORMAL HIGH (ref 11.5–15.5)
WBC: 7.4 10*3/uL (ref 4.0–10.5)

## 2013-02-08 ENCOUNTER — Other Ambulatory Visit (HOSPITAL_COMMUNITY): Payer: Self-pay

## 2013-02-08 DIAGNOSIS — J810 Acute pulmonary edema: Secondary | ICD-10-CM | POA: Diagnosis present

## 2013-02-08 DIAGNOSIS — J9600 Acute respiratory failure, unspecified whether with hypoxia or hypercapnia: Secondary | ICD-10-CM

## 2013-02-08 DIAGNOSIS — R0902 Hypoxemia: Secondary | ICD-10-CM | POA: Diagnosis present

## 2013-02-08 LAB — BASIC METABOLIC PANEL
Chloride: 97 mEq/L (ref 96–112)
Creatinine, Ser: 1.17 mg/dL — ABNORMAL HIGH (ref 0.50–1.10)
GFR calc Af Amer: 53 mL/min — ABNORMAL LOW (ref >90)
GFR calc non Af Amer: 46 mL/min — ABNORMAL LOW (ref >90)
Potassium: 4.4 mEq/L (ref 3.5–5.1)

## 2013-02-08 LAB — BLOOD GAS, ARTERIAL
Bicarbonate: 30.5 mEq/L — ABNORMAL HIGH (ref 20.0–24.0)
O2 Saturation: 99.4 %
PEEP: 10 cmH2O
Patient temperature: 98.6
RATE: 14 resp/min
pH, Arterial: 7.392 (ref 7.350–7.450)

## 2013-02-08 LAB — TROPONIN I: Troponin I: <0.3 ng/mL (ref <0.30)

## 2013-02-08 NOTE — Consult Note (Signed)
PULMONARY  / CRITICAL CARE MEDICINE  Name: Elizabeth Maddox MRN: [id number] DOB: 04-02-1941    ADMISSION DATE:  01/25/2013 CONSULTATION DATE: 4-10  REFERRING MD :  [facility] PRIMARY SERVICE:  [facility]  CHIEF COMPLAINT:  VDRF  BRIEF PATIENT DESCRIPTION:  Frail [name] with chronic aspiration, O2 dependent COPD intubated 4-10. SIGNIFICANT EVENTS / STUDIES:  4-10 pulmonary distress and OTT placed  LINES / TUBES: 4-10 ott>> 3-28 peg>> Picc>>  CULTURES:   ANTIBIOTICS: Per ssh  HISTORY OF PRESENT ILLNESS:   Frail [name] with chronic aspiration, O2 dependent COPD intubated 4-10. She was admitted to [facility] 3-18 with multiple HCAP along with FTT from [facility] She had been treated for HCAP and PEG was placed 3-28 for chronic aspiration. She was scheduled for DC soon but she most likely aspirated (suspect  PO feeds despite being NPO). She decompensated , became hypoxic and required urgent intubation on 4-10. PCCM asked to assist with vent manement.  PAST MEDICAL HISTORY :   Acute on chronic resp failure HCAP Chronic aspiration  FTT Poorly controlled dm HTN Chronic pain  FTT FAMILY HISTORY:  +stroke dad, Parkinson in mother  SOCIAL HISTORY: +80 ppy tobacco REVIEW OF SYSTEMS:   NA  SUBJECTIVE:   VITAL SIGNS: Vital signs reviewed. Abnormal values will appear under impression plan section.    PHYSICAL EXAMINATION: General:  Frail elderly female sedated on vent Neuro:  Responds to noxious stimuli HEENT:  OTT-> vent Neck:  No LAN Cardiovascular:  HSR RRR Lungs:  Coarse rhonchi bilat Abdomen:  Peg in place(3-28) Musculoskeletal:  Wasted musculature Skin:  warm   Recent Labs Lab 02/06/13 1652 02/07/13 1250 02/08/13 0705  NA 137 138 135  K 4.4 4.4 4.4  CL 103 103 97  CO2 24 28 29   BUN 26* 24* 28*  CREATININE 1.21* 1.15* 1.17*  GLUCOSE 115* 127* 145*    Recent Labs Lab 02/04/13 0631 02/06/13 1652 02/07/13 1250  HGB 9.1* 8.9* 8.3*  HCT 27.1* 26.4* 24.1*  WBC 6.9 6.9 7.4  PLT  224 290 328   Dg Chest Port 1 View  02/08/2013  *RADIOLOGY REPORT*  Clinical Data: Respiratory difficulty  PORTABLE CHEST - 1 VIEW  Comparison: Yesterday  Findings: Mild cardiomegaly.  Extensive bilateral consolidation has improved.  Diffuse vascular congestion and interstitial edema have worsened.  Endotracheal tube placed.  Tip is 1.8 cm from the carina.  Right PICC stable.  No pneumothorax.  IMPRESSION: Endotracheal tube placed.  Improved bilateral airspace disease. Worsening diffuse interstitial edema.   Original Report Authenticated By: Jolaine Click, M.D.    Dg Chest Port 1 View  02/07/2013  *RADIOLOGY REPORT*  Clinical Data: Shortness of breath, chest pain  PORTABLE CHEST - 1 VIEW  Comparison: 01/29/2013; 01/17/2013  Findings:  Grossly unchanged enlarged cardiac silhouette and mediastinal contours. Stable positioning of support apparatus.  Lung volumes remain persistently reduced.  Interval development of extensive bilateral perihilar predominant heterogeneous air space opacities. Interval development of small left-sided pleural effusion.  No definite right-sided pleural effusion.  Pulmonary vasculature is indistinct.  No definite pneumothorax.  Unchanged bones.  IMPRESSION:  1. Interval worsening of now extensive bilateral air space opacities - while possibly asymmetric alveolar pulmonary edema, underlying infection not excluded. 2.  Interval development of a small left-sided pleural effusion.   Original Report Authenticated By: Tacey Ruiz, MD     ASSESSMENT / PLAN: VDRF in 72 yo with chronic aspiration, volume overload, multiple HCAP. She has severe underlying COPD which is O2 dependent,  Chronic aspiration with PEG placed 3-28 but is suspected on continued PO intake. 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 Plan: Wean vent O2 and peep per protocol. Currently on 60%fio2 and 10 of peep Abx as noted NPO BD's May need trach  All other health issues per ssh.   Brett Canales Minor ACNP [facility]  PCCM Pager [phone] till 3 pm If no answer page [phone] 02/08/2013, 2:26 PM  Will need aggressive diureses renal function and hemodynamics permitting.  Titrate FiO2/PEEP per protocol and will attempt SBT if FiO2 is at 40 and PEEP at 5.  WIll continue to follow without.  CC time 40 min.  For services rendered 02/08/13.  Alyson Reedy, M.D. Pulmonary and Critical Care Medicine [facility] Pager: [phone]  02/08/2013, 2:05 PM

## 2013-02-09 ENCOUNTER — Other Ambulatory Visit (HOSPITAL_COMMUNITY): Payer: Self-pay

## 2013-02-09 LAB — CBC
MCHC: 33.3 g/dL (ref 30.0–36.0)
Platelets: 330 10*3/uL (ref 150–400)
RBC: 2.64 MIL/uL — ABNORMAL LOW (ref 3.87–5.11)
RDW: 16.9 % — ABNORMAL HIGH (ref 11.5–15.5)
RDW: 16.7 % — ABNORMAL HIGH (ref 11.5–15.5)
WBC: 7.2 10*3/uL (ref 4.0–10.5)
WBC: 9.3 10*3/uL (ref 4.0–10.5)

## 2013-02-09 LAB — RENAL FUNCTION PANEL
Albumin: 2 g/dL — ABNORMAL LOW (ref 3.5–5.2)
CO2: 30 mEq/L (ref 19–32)
Chloride: 98 mEq/L (ref 96–112)
Creatinine, Ser: 1.78 mg/dL — ABNORMAL HIGH (ref 0.50–1.10)
GFR calc Af Amer: 32 mL/min — ABNORMAL LOW (ref >90)
GFR calc non Af Amer: 28 mL/min — ABNORMAL LOW (ref >90)
Sodium: 137 mEq/L (ref 135–145)

## 2013-02-09 LAB — CLOSTRIDIUM DIFFICILE BY PCR: Toxigenic C. Difficile by PCR: NEGATIVE

## 2013-02-09 LAB — PREPARE RBC (CROSSMATCH)

## 2013-02-09 LAB — ABO/RH: ABO/RH(D): O POS

## 2013-02-09 NOTE — Consult Note (Signed)
PULMONARY  / CRITICAL CARE MEDICINE  Name: Elizabeth Maddox MRN: [id number] DOB: [birth year]    ADMISSION DATE:  2013-02-12 CONSULTATION DATE: 4-10  REFERRING MD :  [facility] PRIMARY SERVICE:  [facility]  CHIEF COMPLAINT:  VDRF  BRIEF PATIENT DESCRIPTION:  Frail [name] with chronic aspiration, O2 dependent COPD intubated 4-10. SIGNIFICANT EVENTS / STUDIES:  4-10 pulmonary distress and OTT placed  LINES / TUBES: 4-10 ott>> 3-28 peg>> Picc>>  CULTURES:   ANTIBIOTICS: Per ssh  HISTORY OF PRESENT ILLNESS:   Frail [name] with chronic aspiration, O2 dependent COPD intubated 4-10. She was admitted to [facility] 3-18 with multiple HCAP along with FTT from [facility]. She had been treated for HCAP and PEG was placed 3-28 for chronic aspiration. She was scheduled for DC soon but she most likely aspirated (suspect  PO feeds despite being NPO). She decompensated , became hypoxic and required urgent intubation on 4-10. PCCM asked to assist with vent manement.  PAST MEDICAL HISTORY :   Acute on chronic resp failure HCAP Chronic aspiration  FTT Poorly controlled dm HTN Chronic pain  FTT FAMILY HISTORY:  +stroke dad, Parkinson in mother  SOCIAL HISTORY: +80 ppy tobacco REVIEW OF SYSTEMS:   NA  SUBJECTIVE:   VITAL SIGNS: Vital signs reviewed. Abnormal values will appear under impression plan section.    PHYSICAL EXAMINATION: General:  Frail elderly female sedated on vent Neuro:  Responds to noxious stimuli HEENT:  OTT-> vent Neck:  No LAN Cardiovascular:  HSR RRR Lungs:  Coarse rhonchi bilat Abdomen:  Peg in place(3-28) Musculoskeletal:  Wasted musculature Skin:  warm   Recent Labs Lab 02/07/13 1250 02/08/13 0705 02/09/13 0612  NA 138 135 137  K 4.4 4.4 5.8*  CL 103 97 98  CO2 28 29 30   BUN 24* 28* 41*  CREATININE 1.15* 1.17* 1.78*  GLUCOSE 127* 145* 121*    Recent Labs Lab 02/06/13 1652 02/07/13 1250 02/09/13 0612  HGB 8.9* 8.3* 8.0*  HCT 26.4* 24.1* 24.2*  WBC 6.9 7.4 7.2  PLT  290 328 330   Dg Chest Port 1 View  02/09/2013  *RADIOLOGY REPORT*  Clinical Data: Endotracheal tube  PORTABLE CHEST - 1 VIEW  Comparison: 02/08/2013; 02/07/2013; 01/17/2013  Findings:  Grossly unchanged enlarged cardiac silhouette and mediastinal contours.  Stable position of support apparatus.  Improved aeration of the bilateral upper lungs with otherwise unchanged bibasilar heterogeneous opacities.  Unchanged trace/small bilateral effusions.  No pneumothorax.  Unchanged bones.  IMPRESSION: 1.  Stable positioning of support apparatus.  No pneumothorax. 2.  Suspected improving pulmonary edema with residual bilateral effusions and bibasilar opacities, atelectasis versus infiltrate.   Original Report Authenticated By: Tacey Ruiz, MD    Dg Chest Port 1 View  02/08/2013  *RADIOLOGY REPORT*  Clinical Data: Respiratory difficulty  PORTABLE CHEST - 1 VIEW  Comparison: Yesterday  Findings: Mild cardiomegaly.  Extensive bilateral consolidation has improved.  Diffuse vascular congestion and interstitial edema have worsened.  Endotracheal tube placed.  Tip is 1.8 cm from the carina.  Right PICC stable.  No pneumothorax.  IMPRESSION: Endotracheal tube placed.  Improved bilateral airspace disease. Worsening diffuse interstitial edema.   Original Report Authenticated By: Jolaine Click, M.D.    Dg Chest Port 1 View  02/07/2013  *RADIOLOGY REPORT*  Clinical Data: Shortness of breath, chest pain  PORTABLE CHEST - 1 VIEW  Comparison: 01/29/2013; 01/17/2013  Findings:  Grossly unchanged enlarged cardiac silhouette and mediastinal contours. Stable positioning of support apparatus.  Lung volumes remain persistently  reduced.  Interval development of extensive bilateral perihilar predominant heterogeneous air space opacities. Interval development of small left-sided pleural effusion.  No definite right-sided pleural effusion.  Pulmonary vasculature is indistinct.  No definite pneumothorax.  Unchanged bones.  IMPRESSION:  1.  Interval worsening of now extensive bilateral air space opacities - while possibly asymmetric alveolar pulmonary edema, underlying infection not excluded. 2.  Interval development of a small left-sided pleural effusion.   Original Report Authenticated By: Tacey Ruiz, MD     ASSESSMENT / PLAN: VDRF in 72 yo with chronic aspiration, volume overload, multiple HCAP. She has severe underlying COPD which is O2 dependent, Chronic aspiration with PEG placed 3-28 but is suspected on continued PO intake. 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 Plan: - Decreased to PEEP of 10 and FiO2 of 50.  Would continue to diurese as renal function tolerates and once at 40 and 5 then would begin PS trials, anticipate by AM. - Abx as noted - TF per nutrition. - BD's. - May need trach but will not check on that until patient has failed weaning.  Alyson Reedy, M.D. [facility] Pulmonary/Critical Care Medicine. Pager: [phone]. After hours pager: [phone].

## 2013-02-10 LAB — CBC
MCHC: 32.9 g/dL (ref 30.0–36.0)
Platelets: 403 10*3/uL — ABNORMAL HIGH (ref 150–400)
RDW: 16.8 % — ABNORMAL HIGH (ref 11.5–15.5)

## 2013-02-10 LAB — CULTURE, RESPIRATORY W GRAM STAIN: Culture: NO GROWTH

## 2013-02-11 LAB — BASIC METABOLIC PANEL
GFR calc Af Amer: 31 mL/min — ABNORMAL LOW (ref >90)
GFR calc non Af Amer: 26 mL/min — ABNORMAL LOW (ref >90)
Potassium: 5.1 mEq/L (ref 3.5–5.1)
Sodium: 138 mEq/L (ref 135–145)

## 2013-02-12 ENCOUNTER — Other Ambulatory Visit (HOSPITAL_COMMUNITY): Payer: Self-pay

## 2013-02-12 LAB — BASIC METABOLIC PANEL
CO2: 34 mEq/L — ABNORMAL HIGH (ref 19–32)
GFR calc non Af Amer: 28 mL/min — ABNORMAL LOW (ref >90)
Glucose, Bld: 121 mg/dL — ABNORMAL HIGH (ref 70–99)
Potassium: 4.2 mEq/L (ref 3.5–5.1)
Sodium: 139 mEq/L (ref 135–145)

## 2013-02-12 LAB — CBC
Hemoglobin: 8.5 g/dL — ABNORMAL LOW (ref 12.0–15.0)
MCHC: 33.5 g/dL (ref 30.0–36.0)
RBC: 2.82 MIL/uL — ABNORMAL LOW (ref 3.87–5.11)
WBC: 8.8 10*3/uL (ref 4.0–10.5)

## 2013-02-12 LAB — BLOOD GAS, ARTERIAL
Acid-Base Excess: 7.2 mmol/L — ABNORMAL HIGH (ref 0.0–2.0)
Delivery systems: POSITIVE
Inspiratory PAP: 12
O2 Saturation: 96.3 %
pCO2 arterial: 56.8 mmHg — ABNORMAL HIGH (ref 35.0–45.0)

## 2013-02-12 NOTE — Progress Notes (Addendum)
PULMONARY  / CRITICAL CARE MEDICINE  Name: Elizabeth Maddox MRN: [id number] DOB: Jul 12, 1941    ADMISSION DATE:  01/20/2013 CONSULTATION DATE: 4-10  REFERRING MD :  [facility] PRIMARY SERVICE:  [facility]  CHIEF COMPLAINT:  VDRF  BRIEF PATIENT DESCRIPTION:  Frail [name] with chronic aspiration due to esophageal dysmotility, O2 dependent COPD intubated 4-10. SIGNIFICANT EVENTS / STUDIES:  4-10 pulmonary distress and OTT placed  LINES / TUBES: 4-10 ott>> 3-28 peg>> Picc>>  CULTURES:   ANTIBIOTICS: Per ssh  HISTORY OF PRESENT ILLNESS:   Frail [name] with chronic aspiration, O2 dependent COPD intubated 4-10. She was admitted to [facility] 3-18 with multiple HCAP along with FTT from [facility]. She had been treated for HCAP and PEG was placed 3-28 for chronic aspiration. She was scheduled for DC soon but she most likely aspirated (suspect  PO feeds despite being NPO). She decompensated , became hypoxic and required urgent intubation on 4-10. PCCM asked to assist with vent manement.  PAST MEDICAL HISTORY :   Acute on chronic resp failure HCAP Chronic aspiration  FTT Poorly controlled dm HTN Chronic pain  FTT  SUBJECTIVE: denies pain, dyspnea Good Tv on PS 5/5  VITAL SIGNS: Vital signs reviewed. Abnormal values will appear under impression plan section.    PHYSICAL EXAMINATION: General:  Frail elderly female sedated on vent Neuro:  Responds to noxious stimuli HEENT:  OTT-> vent Neck:  No LAN Cardiovascular:  HSR RRR Lungs:  Decreased  rhonchi bilat Abdomen:  Peg in place(3-28) Musculoskeletal:  Wasted musculature Skin:  warm   Recent Labs Lab 02/09/13 0612 02/11/13 0720 02/12/13 0538  NA 137 138 139  K 5.8* 5.1 4.2  CL 98 95* 94*  CO2 30 36* 34*  BUN 41* 54* 57*  CREATININE 1.78* 1.86* 1.76*  GLUCOSE 121* 162* 121*    Recent Labs Lab 02/09/13 1554 02/10/13 0600 02/12/13 0538  HGB 8.4* 8.4* 8.5*  HCT 25.2* 25.5* 25.4*  WBC 9.3 8.9 8.8  PLT 368 403* 451*   Dg Chest Port 1  View  02/12/2013  *RADIOLOGY REPORT*  Clinical Data: Acute respiratory failure  PORTABLE CHEST - 1 VIEW  Comparison: 02/09/2013  Findings: The endotracheal tube tip is above the carina.  There is a right arm PICC line with tip in the projection of the cavoatrial junction.  Cardiac enlargement is stable.  Again noted is moderate interstitial edema which appears improved from previous exam. Plate-like atelectasis noted within the lung bases.  IMPRESSION:  1.  Persistent but improved interstitial edema.   Original Report Authenticated By: Signa Kell, M.D.     ASSESSMENT / PLAN: VDRF in 72 yo with chronic aspiration, volume overload, multiple HCAP. She has severe underlying COPD which is O2 dependent, Chronic aspiration with PEG placed 3-28 but is suspected on continued PO intake.  Rapid improvement in infx suggestive of pulm edema rather than continued aspiration Plan: - tolerating PS trials - , meets extubation criteria. Try extubation if she fails then reintubate  - meropenem & vanc per primary svc - TF per nutrition - hold x 4h poste xtubation - BD's. -Cr rose to 1.7 with diuresis, stable now    Brett Canales Minor ACNP [facility] PCCM Pager [phone] till 3 pm If no answer page [phone]  Independently examined pt, evaluated data & formulated above care plan with NP, modified plan, orders written Updated son  Oretha Milch.  02/12/2013, 10:43 AM

## 2013-02-13 ENCOUNTER — Other Ambulatory Visit (HOSPITAL_COMMUNITY): Payer: Self-pay

## 2013-02-13 DIAGNOSIS — J449 Chronic obstructive pulmonary disease, unspecified: Secondary | ICD-10-CM

## 2013-02-13 LAB — TYPE AND SCREEN
ABO/RH(D): O POS
Unit division: 0

## 2013-02-13 LAB — BASIC METABOLIC PANEL
BUN: 50 mg/dL — ABNORMAL HIGH (ref 6–23)
Calcium: 9.6 mg/dL (ref 8.4–10.5)
Creatinine, Ser: 1.29 mg/dL — ABNORMAL HIGH (ref 0.50–1.10)
GFR calc non Af Amer: 41 mL/min — ABNORMAL LOW (ref >90)
Glucose, Bld: 137 mg/dL — ABNORMAL HIGH (ref 70–99)
Sodium: 140 mEq/L (ref 135–145)

## 2013-02-13 NOTE — Progress Notes (Signed)
PULMONARY  / CRITICAL CARE MEDICINE  Name: Elizabeth Maddox MRN: [id number] DOB: 1941-09-09    ADMISSION DATE:  01/24/2013 CONSULTATION DATE: 4-10  REFERRING MD :  [facility] PRIMARY SERVICE:  [facility]  CHIEF COMPLAINT:  VDRF  BRIEF PATIENT DESCRIPTION:  Frail [name] with chronic aspiration due to esophageal dysmotility, O2 dependent COPD intubated 4-10. SIGNIFICANT EVENTS / STUDIES:  4-10 pulmonary distress and OTT placed  LINES / TUBES: 4-10 ott>>4/14 3-28 peg>> Picc>>  CULTURES: resp 3/25>>  e coli    ANTIBIOTICS: Meropenem' vanc   HISTORY OF PRESENT ILLNESS:   Frail [name] with chronic aspiration, O2 dependent COPD intubated 4-10. She was admitted to [facility] 3-18 with multiple HCAP along with FTT from [facility]. She had been treated for HCAP and PEG was placed 3-28 for chronic aspiration. She was scheduled for DC soon but she most likely aspirated (suspect  PO feeds despite being NPO). She decompensated , became hypoxic and required urgent intubation on 4-10. PCCM asked to assist with vent manement.  PAST MEDICAL HISTORY :   Acute on chronic resp failure HCAP Chronic aspiration  FTT Poorly controlled dm HTN Chronic pain  FTT  SUBJECTIVE: denies pain, dyspnea Required bipap post extubation desatn off bipap  VITAL SIGNS: Vital signs reviewed. Abnormal values will appear under impression plan section.    PHYSICAL EXAMINATION: General:  Frail elderly female , on bipap, anxious affect Neuro:  Responds to noxious stimuli HEENT: no pallor, icterus, JVD Neck:  No LAN Cardiovascular:  HSR RRR Lungs:   rhonchi bilat, upper airway pseudo wheeze + Abdomen:  Peg in place(3-28) Musculoskeletal:  Wasted musculature Skin:  warm   Recent Labs Lab 02/11/13 0720 02/12/13 0538 02/13/13 0730  NA 138 139 140  K 5.1 4.2 3.7  CL 95* 94* 96  CO2 36* 34* 35*  BUN 54* 57* 50*  CREATININE 1.86* 1.76* 1.29*  GLUCOSE 162* 121* 137*    Recent Labs Lab 02/09/13 1554 02/10/13 0600  02/12/13 0538  HGB 8.4* 8.4* 8.5*  HCT 25.2* 25.5* 25.4*  WBC 9.3 8.9 8.8  PLT 368 403* 451*   Dg Chest Port 1 View  02/13/2013  *RADIOLOGY REPORT*  Clinical Data: On CPAP  PORTABLE CHEST - 1 VIEW  Comparison: Portable exam 0651 hours compared to 02/12/2013  Findings: Interval removal of endotracheal tube. Right arm PICC line tip projects over SVC. Enlargement of cardiac silhouette with pulmonary vascular congestion. Increased atelectasis versus consolidation left lower lobe. Minimal right basilar atelectasis. Upper lungs clear. No pleural effusion or pneumothorax. Bones demineralized.  IMPRESSION: Enlargement of cardiac silhouette with pulmonary vascular congestion. Increased left lower lobe consolidation versus atelectasis. Subsegmental atelectasis right base.   Original Report Authenticated By: Ulyses Southward, M.D.    Dg Chest Port 1 View  02/12/2013  *RADIOLOGY REPORT*  Clinical Data: Acute respiratory failure  PORTABLE CHEST - 1 VIEW  Comparison: 02/09/2013  Findings: The endotracheal tube tip is above the carina.  There is a right arm PICC line with tip in the projection of the cavoatrial junction.  Cardiac enlargement is stable.  Again noted is moderate interstitial edema which appears improved from previous exam. Plate-like atelectasis noted within the lung bases.  IMPRESSION:  1.  Persistent but improved interstitial edema.   Original Report Authenticated By: Signa Kell, M.D.    ABG reviewed  ASSESSMENT / PLAN: VDRF in 72 yo with chronic aspiration, volume overload, multiple HCAP. She has severe underlying COPD which is O2 dependent, Chronic aspiration with PEG placed 3-28  but is suspected on continued PO intake.  Rapid improvement in infx suggestive of pulm edema rather than continued aspiration LLL atx persists Plan: - Bipap with breaks every 4h & increase duration of breaks - meropenem & vanc per primary svc - TF per nutrition - BD's.- dc spiriva, use albuterol/ atrovent -Add mucinex  600 bid & mucormyes & chest vest bid to mobilise secretions -Cr rose to 1.7 with diuresis, stable now    Cyril Mourning MD. FCCP.  Pulmonary & Critical care Pager [phone] If no response call [phone]   Updated son Aurelio Brash in detail cctime x 32 m  ALVA,RAKESH V.  02/13/2013, 10:01 AM

## 2013-02-14 LAB — CBC
HCT: 27.1 % — ABNORMAL LOW (ref 36.0–46.0)
MCV: 89.1 fL (ref 78.0–100.0)
RBC: 3.04 MIL/uL — ABNORMAL LOW (ref 3.87–5.11)
WBC: 9.1 10*3/uL (ref 4.0–10.5)

## 2013-02-14 LAB — BLOOD GAS, ARTERIAL
Acid-Base Excess: 9 mmol/L — ABNORMAL HIGH (ref 0.0–2.0)
Bicarbonate: 33.6 mEq/L — ABNORMAL HIGH (ref 20.0–24.0)
TCO2: 35.2 mmol/L (ref 0–100)
pCO2 arterial: 51.3 mmHg — ABNORMAL HIGH (ref 35.0–45.0)
pO2, Arterial: 99.7 mmHg (ref 80.0–100.0)

## 2013-02-14 LAB — BASIC METABOLIC PANEL
BUN: 58 mg/dL — ABNORMAL HIGH (ref 6–23)
CO2: 34 mEq/L — ABNORMAL HIGH (ref 19–32)
Chloride: 94 mEq/L — ABNORMAL LOW (ref 96–112)
Creatinine, Ser: 1.21 mg/dL — ABNORMAL HIGH (ref 0.50–1.10)

## 2013-02-14 NOTE — Progress Notes (Signed)
PULMONARY  / CRITICAL CARE MEDICINE  Name: Elizabeth Maddox MRN: [id number] DOB: Sep 03, 1941    ADMISSION DATE:  01/05/2013 CONSULTATION DATE: 4-10  REFERRING MD :  [facility] PRIMARY SERVICE:  [facility]  CHIEF COMPLAINT:  VDRF  BRIEF PATIENT DESCRIPTION:  Frail Elizabeth Maddox with chronic aspiration due to esophageal dysmotility, O2 dependent COPD intubated 4-10.  SIGNIFICANT EVENTS / STUDIES:  4-10 pulmonary distress and OTT placed  LINES / TUBES: 4-10 ott>>4/14 3-28 peg>> Picc>>  CULTURES: resp 3/25>>  e coli    ANTIBIOTICS: Meropenem' vanc   HISTORY OF PRESENT ILLNESS:   Frail Elizabeth Maddox with chronic aspiration, O2 dependent COPD intubated 4-10. She was admitted to [facility] 3-18 with multiple HCAP along with FTT from [facility]. She had been treated for HCAP and PEG was placed 3-28 for chronic aspiration. She was scheduled for DC soon but she most likely aspirated (suspect  PO feeds despite being NPO). She decompensated , became hypoxic and required urgent intubation on 4-10. PCCM asked to assist with vent manement.   SUBJECTIVE: denies pain, dyspnea Tolerates bipap well overnight, was able to stay off x 5h on 4/16 Good cough  VITAL SIGNS: Vital signs reviewed. Abnormal values will appear under impression plan section.    PHYSICAL EXAMINATION: General:  Frail elderly female , on bipap, anxious affect Neuro:  Responds to noxious stimuli HEENT: no pallor, icterus, JVD Neck:  No LAN Cardiovascular:  HSR RRR Lungs:   rhonchi on rt, lt clear, upper airway pseudo wheeze + Abdomen:  Peg in place(3-28) Musculoskeletal:  Wasted musculature Skin:  warm   Recent Labs Lab 02/12/13 0538 02/13/13 0730 02/14/13 0650  NA 139 140 139  K 4.2 3.7 4.0  CL 94* 96 94*  CO2 34* 35* 34*  BUN 57* 50* 58*  CREATININE 1.76* 1.29* 1.21*  GLUCOSE 121* 137* 232*    Recent Labs Lab 02/10/13 0600 02/12/13 0538 02/14/13 0650  HGB 8.4* 8.5* 9.1*  HCT 25.5* 25.4* 27.1*  WBC 8.9 8.8 9.1  PLT 403* 451* 475*   Dg  Chest Port 1 View  02/13/2013  *RADIOLOGY REPORT*  Clinical Data: On CPAP  PORTABLE CHEST - 1 VIEW  Comparison: Portable exam 0651 hours compared to 02/12/2013  Findings: Interval removal of endotracheal tube. Right arm PICC line tip projects over SVC. Enlargement of cardiac silhouette with pulmonary vascular congestion. Increased atelectasis versus consolidation left lower lobe. Minimal right basilar atelectasis. Upper lungs clear. No pleural effusion or pneumothorax. Bones demineralized.  IMPRESSION: Enlargement of cardiac silhouette with pulmonary vascular congestion. Increased left lower lobe consolidation versus atelectasis. Subsegmental atelectasis right base.   Original Report Authenticated By: Ulyses Southward, M.D.    ABG reviewed  ASSESSMENT / PLAN: VDRF in 72 yo with chronic aspiration, volume overload, multiple HCAP. She has severe underlying COPD which is O2 dependent, Chronic aspiration with PEG placed 3-28 but is suspected on continued PO intake.  Rapid improvement in infx suggestive of pulm edema rather than continued aspiration,  LLL atx persists  Plan: - Bipap intermittent prn only,  increase duration of breaks - meropenem & vanc per primary svc - TF per nutrition - BD's.- dc'd  spiriva, use albuterol/ atrovent -Add mucinex 600 bid & mucormyst & chest vest bid to mobilise secretions -OK to start PT  ARF -Cr rose to 1.7 with diuresis, decreasing now   Updated son Elizabeth Maddox in detail  Elizabeth Mourning MD. Elizabeth Maddox. [place] [facility] Pager [phone] If no response call [phone]  Elizabeth Maddox V.  02/14/2013, 10:59 AM

## 2013-02-15 NOTE — Progress Notes (Signed)
PULMONARY  / CRITICAL CARE MEDICINE  Name: Elizabeth Maddox MRN: [id number] DOB: [birth year]    ADMISSION DATE:  01/22/2013 CONSULTATION DATE: 4-10  REFERRING MD :  [facility] PRIMARY SERVICE:  [facility]  CHIEF COMPLAINT:  VDRF  BRIEF PATIENT DESCRIPTION:  Frail [name] with chronic aspiration due to esophageal dysmotility, O2 dependent COPD intubated 4-10.  SIGNIFICANT EVENTS / STUDIES:  4-10 pulmonary distress and OTT placed  LINES / TUBES: 4-10 ott>>4/14 3-28 peg>> Picc>>  CULTURES: resp 3/25>>  e coli    ANTIBIOTICS: Meropenem' vanc   HISTORY OF PRESENT ILLNESS:   Frail [name] with chronic aspiration, O2 dependent COPD intubated 4-10. She was admitted to [facility] 3-18 with multiple HCAP along with FTT from [facility]. She had been treated for HCAP and PEG was placed 3-28 for chronic aspiration. She was scheduled for DC soon but she most likely aspirated (suspect  PO feeds despite being NPO). She decompensated , became hypoxic and required urgent intubation on 4-10. PCCM asked to assist with vent manement.   SUBJECTIVE: denies pain, dyspnea Off bipap, improving daily Good cough Voice remains hoarse  VITAL SIGNS: Vital signs reviewed. Abnormal values will appear under impression plan section.    PHYSICAL EXAMINATION: General:  Frail elderly female ,  anxious affect Neuro:  Responds to noxious stimuli HEENT: no pallor, icterus, JVD Neck:  No LAN Cardiovascular:  HSR RRR Lungs:   rhonchi on rt, lt clear, upper airway pseudo wheeze + Abdomen:  Peg in place(3-28) Musculoskeletal:  Wasted musculature Skin:  warm   Recent Labs Lab 02/12/13 0538 02/13/13 0730 02/14/13 0650  NA 139 140 139  K 4.2 3.7 4.0  CL 94* 96 94*  CO2 34* 35* 34*  BUN 57* 50* 58*  CREATININE 1.76* 1.29* 1.21*  GLUCOSE 121* 137* 232*    Recent Labs Lab 02/10/13 0600 02/12/13 0538 02/14/13 0650  HGB 8.4* 8.5* 9.1*  HCT 25.5* 25.4* 27.1*  WBC 8.9 8.8 9.1  PLT 403* 451* 475*   No results found. ABG  reviewed  ASSESSMENT / PLAN: VDRF in 72 yo with chronic aspiration, volume overload, multiple HCAP. She has severe underlying COPD which is O2 dependent, Chronic aspiration with PEG placed 3-28 but is suspected on continued PO intake.  Rapid improvement in infx suggestive of pulm edema rather than continued aspiration,  LLL atx persists  Plan: - Bipap  prn only - meropenem & vanc per primary svc - TF per nutrition - BD's.- dc'd  spiriva, use albuterol/ atrovent -Add mucinex 600 bid & mucormyst & chest vest bid to mobilise secretions -OK to increase level of PT  ARF -Cr rose to 1.7 with diuresis, decreasing now   Updated son Elizabeth Maddox & sister  Elizabeth Mourning MD. FCCP. [place] [facility] Critical [facility] Pager [phone] If no response call [phone]      Elizabeth Maddox V.  02/15/2013, 1:24 PM

## 2013-02-16 LAB — BASIC METABOLIC PANEL
CO2: 38 mEq/L — ABNORMAL HIGH (ref 19–32)
Calcium: 10.2 mg/dL (ref 8.4–10.5)
Chloride: 96 mEq/L (ref 96–112)
Creatinine, Ser: 1.06 mg/dL (ref 0.50–1.10)
Glucose, Bld: 234 mg/dL — ABNORMAL HIGH (ref 70–99)
Sodium: 145 mEq/L (ref 135–145)

## 2013-02-18 ENCOUNTER — Other Ambulatory Visit (HOSPITAL_COMMUNITY): Payer: Self-pay

## 2013-02-18 LAB — CBC
HCT: 30 % — ABNORMAL LOW (ref 36.0–46.0)
Hemoglobin: 9.9 g/dL — ABNORMAL LOW (ref 12.0–15.0)
MCV: 90.4 fL (ref 78.0–100.0)
RDW: 15.1 % (ref 11.5–15.5)
WBC: 18 10*3/uL — ABNORMAL HIGH (ref 4.0–10.5)

## 2013-02-18 LAB — RENAL FUNCTION PANEL
Albumin: 3 g/dL — ABNORMAL LOW (ref 3.5–5.2)
BUN: 65 mg/dL — ABNORMAL HIGH (ref 6–23)
Chloride: 100 mEq/L (ref 96–112)
Creatinine, Ser: 0.95 mg/dL (ref 0.50–1.10)
Glucose, Bld: 195 mg/dL — ABNORMAL HIGH (ref 70–99)

## 2013-02-19 LAB — BASIC METABOLIC PANEL
BUN: 67 mg/dL — ABNORMAL HIGH (ref 6–23)
CO2: 36 mEq/L — ABNORMAL HIGH (ref 19–32)
Calcium: 10.1 mg/dL (ref 8.4–10.5)
Chloride: 100 mEq/L (ref 96–112)
Creatinine, Ser: 0.99 mg/dL (ref 0.50–1.10)
Glucose, Bld: 117 mg/dL — ABNORMAL HIGH (ref 70–99)

## 2013-02-19 LAB — CBC
HCT: 28.3 % — ABNORMAL LOW (ref 36.0–46.0)
Hemoglobin: 9.5 g/dL — ABNORMAL LOW (ref 12.0–15.0)
MCH: 30.1 pg (ref 26.0–34.0)
MCV: 89.6 fL (ref 78.0–100.0)
RBC: 3.16 MIL/uL — ABNORMAL LOW (ref 3.87–5.11)
WBC: 14.5 10*3/uL — ABNORMAL HIGH (ref 4.0–10.5)

## 2013-02-19 NOTE — Progress Notes (Signed)
PULMONARY  / CRITICAL CARE MEDICINE  Name: Elizabeth Maddox MRN: [id number] DOB: 1941-02-07    ADMISSION DATE:  01/19/2013 CONSULTATION DATE: 4-10  REFERRING MD :  [facility] PRIMARY SERVICE:  [facility]  CHIEF COMPLAINT:  VDRF  BRIEF PATIENT DESCRIPTION:  Frail [name] with chronic aspiration due to esophageal dysmotility, O2 dependent COPD intubated 4-10.  SIGNIFICANT EVENTS / STUDIES:  4-10 pulmonary distress and OTT placed  LINES / TUBES: 4-10 ott>>4/14 3-28 peg>> Picc>>  CULTURES: resp 3/25>>  e coli    ANTIBIOTICS: Meropenem vanc   HISTORY OF PRESENT ILLNESS:   Frail [name] with chronic aspiration, O2 dependent COPD intubated 4-10. She was admitted to [facility] 3-18 with multiple HCAP along with FTT from [facility]. She had been treated for HCAP and PEG was placed 3-28 for chronic aspiration. She was scheduled for DC soon but she most likely aspirated (suspect  PO feeds despite being NPO). She decompensated , became hypoxic and required urgent intubation on 4-10. PCCM asked to assist with vent manement.   SUBJECTIVE: CO pain. Requires nocturnal and prn bipap. Appears to be weak.  VITAL SIGNS: Vital signs reviewed. Abnormal values will appear under impression plan section.    PHYSICAL EXAMINATION: General:  Frail elderly female ,  anxious affect, wants her pain medication Neuro:  Follows commands, dull affect HEENT: no pallor, icterus, JVD Neck:  No LAN Cardiovascular:  HSR RRR Lungs:   Decreased in bases Abdomen:  Peg in place(3-28) Musculoskeletal:  Wasted musculature Skin:  warm   Recent Labs Lab 02/16/13 0611 02/18/13 0605 02/19/13 0505  NA 145 148* 146*  K 3.3* 3.7 3.5  CL 96 100 100  CO2 38* 37* 36*  BUN 78* 65* 67*  CREATININE 1.06 0.95 0.99  GLUCOSE 234* 195* 117*    Recent Labs Lab 02/14/13 0650 02/18/13 0605 02/19/13 0505  HGB 9.1* 9.9* 9.5*  HCT 27.1* 30.0* 28.3*  WBC 9.1 18.0* 14.5*  PLT 475* 462* 405*   Dg Chest Port 1 View  02/18/2013  *RADIOLOGY REPORT*   Clinical Data: Aspiration pneumonia, chest congestion.  PORTABLE CHEST - 1 VIEW  Comparison: 02/13/2013  Findings: Heart is mildly enlarged.  Improving aeration in the left base with decreasing left lower lobe consolidation.  Right lung is clear.  No visible significant effusion.  No acute bony abnormality.  IMPRESSION: Improving left lower lobe consolidation.   Original Report Authenticated By: Charlett Nose, M.D.    ABG    Component Value Date/Time   PHART 7.432 02/14/2013 0550   PCO2ART 51.3* 02/14/2013 0550   PO2ART 99.7 02/14/2013 0550   HCO3 33.6* 02/14/2013 0550   TCO2 35.2 02/14/2013 0550   O2SAT 97.9 02/14/2013 0550     ASSESSMENT / PLAN: A on C resp failure in 72 yo with chronic aspiration, volume overload, multiple HCAP. She has severe underlying COPD which is O2 dependent, Chronic aspiration with PEG placed 3/28 but is suspected on continued PO intake.  Rapid improvement in infx suggestive of pulm edema rather than continued aspiration,  LLL atx persists  Plan: - Bipap  Nocturnal and prn - meropenem & vanc per primary svc; set stop dates - TF per nutrition - BD's.- dc'd  spiriva, use albuterol/ atrovent -Add mucinex 600 bid & mucormyst & chest vest bid to mobilise secretions -OK to increase level of PT  ARF Lab Results  Component Value Date   CREATININE 0.99 02/19/2013   CREATININE 0.95 02/18/2013   CREATININE 1.06 02/16/2013    -Cr rose to  1.7 with diuresis, decreasing now   Updated  sister  Brett Canales Minor ACNP Ad[facility] Pollack PCCM Pager [phone] till 3 pm If no answer page [phone] 02/19/2013, 9:59 AM  Levy Pupa, MD, PhD 02/19/2013, 12:20 PM [place] [facility] [phone] or if no answer [phone]

## 2013-02-20 NOTE — Progress Notes (Signed)
Asked to see patient by Dr. Annabelle Harman for trach placement.  I attempted to speak with the patient with the sister in the room.  The patient is able to answer questions appropriately.  Sister asked that I speak with the son who does not want me to give the patient any information.  The patient is alert and interactive.  Purpose of POA is if patient is unable to make decision for self.  I think it is completely unethical to withhold information from a patient that is alert.  The sister informed me that they no longer wish for my services.  I will not further see the patient and recommend that primary speaks to family.  If they wish that we intubate and trach patient then please let us know.  Otherwise, PCCM will be available PRN.  Alyson Reedy, M.D. [facility] Pulmonary/Critical Care Medicine. Pager: [phone]. After hours pager: [phone].

## 2013-02-21 ENCOUNTER — Other Ambulatory Visit (HOSPITAL_COMMUNITY): Payer: Self-pay

## 2013-02-21 DIAGNOSIS — R6521 Severe sepsis with septic shock: Secondary | ICD-10-CM

## 2013-02-21 DIAGNOSIS — R1310 Dysphagia, unspecified: Secondary | ICD-10-CM

## 2013-02-21 DIAGNOSIS — A419 Sepsis, unspecified organism: Secondary | ICD-10-CM | POA: Diagnosis present

## 2013-02-21 LAB — CBC
MCV: 88.2 fL (ref 78.0–100.0)
Platelets: 344 10*3/uL (ref 150–400)
RBC: 3.57 MIL/uL — ABNORMAL LOW (ref 3.87–5.11)
RDW: 15.4 % (ref 11.5–15.5)
WBC: 45.5 10*3/uL — ABNORMAL HIGH (ref 4.0–10.5)

## 2013-02-21 LAB — BLOOD GAS, ARTERIAL
Acid-Base Excess: 5.9 mmol/L — ABNORMAL HIGH (ref 0.0–2.0)
Bicarbonate: 29.5 mEq/L — ABNORMAL HIGH (ref 20.0–24.0)
Delivery systems: POSITIVE
FIO2: 0.4 %
FIO2: 60 %
MECHVT: 400 mL
O2 Saturation: 96.2 %
O2 Saturation: 96 %
Patient temperature: 98.6
RATE: 16 resp/min
TCO2: 30.8 mmol/L (ref 0–100)
pO2, Arterial: 69.4 mmHg — ABNORMAL LOW (ref 80.0–100.0)

## 2013-02-21 LAB — BASIC METABOLIC PANEL
CO2: 29 mEq/L (ref 19–32)
Calcium: 9.6 mg/dL (ref 8.4–10.5)
Chloride: 95 mEq/L — ABNORMAL LOW (ref 96–112)
Chloride: 95 mEq/L — ABNORMAL LOW (ref 96–112)
Creatinine, Ser: 1.15 mg/dL — ABNORMAL HIGH (ref 0.50–1.10)
Creatinine, Ser: 1.41 mg/dL — ABNORMAL HIGH (ref 0.50–1.10)
GFR calc Af Amer: 55 mL/min — ABNORMAL LOW (ref >90)
Sodium: 137 mEq/L (ref 135–145)

## 2013-02-21 LAB — PROCALCITONIN: Procalcitonin: 6.51 ng/mL

## 2013-02-21 NOTE — Procedures (Signed)
Intubation Procedure Note BERLINDA FARVE [id number] 1941/09/18  Procedure: Intubation Indications: Airway protection and maintenance  Procedure Details Consent: Risks of procedure as well as the alternatives and risks of each were explained to the (patient/caregiver).  Consent for procedure obtained. Time Out: Verified patient identification, verified procedure, site/side was marked, verified correct patient position, special equipment/implants available, medications/allergies/relevent history reviewed, required imaging and test results available.  Performed  Maximum sterile technique was used including gloves, gown, hand hygiene and mask.  MAC and 3 Meds: versed 2mg , fentanyl , etomidate 20mg     Evaluation Hemodynamic Status: Persistent hypotension treated with pressors; O2 sats: stable throughout Patient's Current Condition: stable Complications: No apparent complications Patient did tolerate procedure well. Chest X-ray ordered to verify placement.  CXR: pending.   Levy Pupa, MD, PhD 02/21/2013, 10:55 AM [place] [facility] [phone] or if no answer [phone]

## 2013-02-21 NOTE — Progress Notes (Signed)
PULMONARY  / CRITICAL CARE MEDICINE  Name: Elizabeth Maddox MRN: [id number] DOB: 08/16/1941    ADMISSION DATE:  01/06/2013 CONSULTATION DATE: 4-10  REFERRING MD :  [facility] PRIMARY SERVICE:  [facility]  CHIEF COMPLAINT:  VDRF  BRIEF PATIENT DESCRIPTION:  Frail elderly WF with chronic aspiration due to esophageal dysmotility, O2 dependent COPD intubated 4-10.  SIGNIFICANT EVENTS / STUDIES:  4/10 pulmonary distress and OTT placed 4/23 - intubated for decreased mental status after episode of drinking NGT flush and choking, ABG relatively good, concern for aspiration & potential c-diff with diarrhea (although PCR negative on 4/10)  LINES / TUBES: 4/10 ott>>4/14 3/28 peg>> Picc ??>>  CULTURES: resp 3/25>>  e coli  3/31 BCx2>>>neg 4/10 C-diff >>>neg 4/10 Resp culture>>>neg  4/23 BCx2>>> 4/23 C-Diff>>> 4/23 UC>>> 4/23 Resp>>>    ANTIBIOTICS: Meropenem 4/23 >>  vanco 4/23 >>    SUBJECTIVE: RT reports pt with increased WBC, witnessed aspiration event yesterday, decreased mental status on BiPAP  VITAL SIGNS: Vital signs reviewed. Abnormal values will appear under impression plan section.    PHYSICAL EXAMINATION: General:  Frail elderly female, obtunded on BiPAP Neuro:  Responds to pain HEENT: no pallor, icterus, JVD Neck:  No LAN Cardiovascular:  HSR RRR Lungs:   Decreased in bases Abdomen:  Peg in place, c/d/i Musculoskeletal:  Wasted musculature Skin:  warm   Recent Labs Lab 02/18/13 0605 02/19/13 0505 02/21/13 0654  NA 148* 146* 137  K 3.7 3.5 3.5  CL 100 100 95*  CO2 37* 36* 28  BUN 65* 67* 61*  CREATININE 0.95 0.99 1.15*  GLUCOSE 195* 117* 230*    Recent Labs Lab 02/18/13 0605 02/19/13 0505 02/21/13 0654  HGB 9.9* 9.5* 10.8*  HCT 30.0* 28.3* 31.5*  WBC 18.0* 14.5* 45.5*  PLT 462* 405* 344   Dg Chest Port 1 View  02/21/2013  *RADIOLOGY REPORT*  Clinical Data: Respiratory failure.  PORTABLE CHEST - 1 VIEW  Comparison: 02/18/2013.  Findings: Elevation of  the left hemidiaphragm has increased compared to prior exam.  New opacity over the right hemidiaphragm is present, probably representing atelectasis.  The right upper extremity PICC is unchanged.  Tip is in the lower SVC. Cardiopericardial silhouette and mediastinal contours are unchanged. Monitoring leads are projected over the chest.  No pneumothorax.  Bilateral glenohumeral osteoarthritis.  IMPRESSION:  1.  Stable right upper extremity PICC. 2.  Lower lung volumes with new opacity at the right lung base favored to represent atelectasis based on decreasing volumes.   Original Report Authenticated By: Andreas Newport, M.D.    ABG    Component Value Date/Time   PHART 7.453* 02/21/2013 0635   PCO2ART 42.8 02/21/2013 0635   PO2ART 80.8 02/21/2013 0635   HCO3 29.5* 02/21/2013 0635   TCO2 30.8 02/21/2013 0635   O2SAT 96.2 02/21/2013 0635     ASSESSMENT / PLAN: Acute on Chronic resp failure -  in 72 yo with chronic aspiration, volume overload, multiple HCAP. She has severe underlying COPD which is O2 dependent, Chronic aspiration with PEG placed 3/28 but is suspected on continued PO intake.  She has had lingering LLL atx vs infiltrate.  Concern for aspiration event 4/22 with pt drinking PEG flush.    Plan: -full vent support -cover for HCAP - defer abx to primary svc >> imipenem + vanco 4/23 -TF per nutrition - BD's. -f/u cxr & ABG post intubation  -CXR in am -resp culture -PRN versed & fentanyl for sedation / pain >> adjust; meds played  a role in her decompensation 4/23 -will need trach in setting of severe dysphagia; will arrange once stable  Septic Shock - concern for sepsis (? c-diff but PCR negative on 4/10), aspiration event, compounded by sedating medications   Plan: -levophed for goal MAP >65 -pan culture -would cover empirically for C-diff colitis until repeat PCR negative -check PCT -saline bolus x 2L -d/c sedating medications (multiple) -d/c BP meds (multiple)     DM  Plan: -recommend d/c metformin and change to SSI  ARF - previous rise in sr. Cr with diuresis.   Lab Results  Component Value Date   CREATININE 1.15* 02/21/2013   CREATININE 0.99 02/19/2013   CREATININE 0.95 02/18/2013   Plan:  -monitor BMP, f/u in am -hold diuresis (was on lasix until 4/23)   Severe Dysphagia  Plan: -strict NPO -PEG feedings for nutrition   Canary Brim, NP-C [place] [facility] Pgr: [phone] or [phone]  CC time 60 minutes  Levy Pupa, MD, PhD 02/21/2013, 11:25 AM [place] [facility] [phone] or if no answer [phone]

## 2013-02-22 ENCOUNTER — Other Ambulatory Visit (HOSPITAL_COMMUNITY): Payer: Self-pay

## 2013-02-22 LAB — PROCALCITONIN: Procalcitonin: 3.96 ng/mL

## 2013-02-22 LAB — URINE CULTURE
Colony Count: >=100000
Special Requests: NORMAL

## 2013-02-22 LAB — CBC
MCH: 30.2 pg (ref 26.0–34.0)
MCHC: 34.8 g/dL (ref 30.0–36.0)
Platelets: 272 10*3/uL (ref 150–400)
RBC: 2.65 MIL/uL — ABNORMAL LOW (ref 3.87–5.11)
RDW: 15.5 % (ref 11.5–15.5)

## 2013-02-22 LAB — BASIC METABOLIC PANEL
Calcium: 8.9 mg/dL (ref 8.4–10.5)
GFR calc non Af Amer: 53 mL/min — ABNORMAL LOW (ref >90)
Sodium: 139 mEq/L (ref 135–145)

## 2013-02-22 LAB — LACTIC ACID, PLASMA: Lactic Acid, Venous: 2.5 mmol/L — ABNORMAL HIGH (ref 0.5–2.2)

## 2013-02-22 LAB — POTASSIUM: Potassium: 4.4 meq/L (ref 3.5–5.1)

## 2013-02-22 NOTE — Progress Notes (Addendum)
PULMONARY  / CRITICAL CARE MEDICINE  Name: Elizabeth Maddox MRN: [id number] DOB: 1941-05-11    ADMISSION DATE:  01/14/2013 CONSULTATION DATE: 4-10  REFERRING MD :  [facility] PRIMARY SERVICE:  [facility]  CHIEF COMPLAINT:  VDRF  BRIEF PATIENT DESCRIPTION:  Frail elderly WF with chronic aspiration due to esophageal dysmotility, O2 dependent COPD intubated 4-10.  SIGNIFICANT EVENTS / STUDIES:  4/10 pulmonary distress and OTT placed 4/23 - intubated for decreased mental status after episode of drinking NGT flush and choking, ABG relatively good, concern for aspiration & potential c-diff with diarrhea (although PCR negative on 4/10)  LINES / TUBES: 4/10 ott>>4/14 4/23 OTT >>  3/28 peg>> Picc ??>>  CULTURES: resp 3/25>>  e coli  3/31 BCx2>>>neg 4/10 C-diff >>>neg 4/10 Resp culture>>>neg  4/23 BCx2>>> GNR's >>  4/23 C-Diff>>> negative 4/23 UC>>> 4/23 Resp>>>    ANTIBIOTICS: Meropenem 4/23 >>  vanco 4/23 >>  vanco per tube 4/23 (empiric) >> 4/24 cipro 4/24 (double cover GNR) >>    SUBJECTIVE:  norepi weaned to off 1700 4/23 Receiving prn sedation pushes  VITAL SIGNS: Vital signs reviewed. Abnormal values will appear under impression plan section.    PHYSICAL EXAMINATION: General:  Frail elderly female, obtunded on MV Neuro:  Responds to pain HEENT: no pallor, icterus, JVD, ETT in place Neck:  No LAN Cardiovascular:  HSR RRR Lungs:   Decreased in bases Abdomen:  Peg in place, c/d/i Musculoskeletal:  Wasted musculature Skin:  warm   Recent Labs Lab 02/21/13 0654 02/21/13 1225 02/22/13 0432  NA 137 137 139  K 3.5 3.5 3.2*  CL 95* 95* 99  CO2 28 29 30   BUN 61* 68* 46*  CREATININE 1.15* 1.41* 1.04  GLUCOSE 230* 319* 131*    Recent Labs Lab 02/19/13 0505 02/21/13 0654 02/22/13 0430  HGB 9.5* 10.8* 8.0*  HCT 28.3* 31.5* 23.0*  WBC 14.5* 45.5* 31.3*  PLT 405* 344 272   Dg Chest Port 1 View  02/22/2013  *RADIOLOGY REPORT*  Clinical Data: Endotracheal tube  assessment  PORTABLE CHEST - 1 VIEW  Comparison: 02/21/2013  Findings: The endotracheal tube tip is low positioned within 1 cm of the carina.  This can be pulled back up to 2 cm to allow positioning in the lower trachea.  The right subclavian CVP is stable in position.  Lower lung volumes are identified with increasing atelectasis at both lung bases and new pulmonary vascular congestion.  An area of focal infiltrate at the right lung base is seen and has a rounded morphology suspicious for an area of focal pneumonia.  This is unchanged in comparison with prior exam. No pleural fluid or overt congestive failure is seen. No new infiltrates noted.  IMPRESSION: Low endotracheal tube position as noted above.  Increasing pulmonary vascular congestion and unchanged right lower lobe infiltrate suspicious for an area of focal pneumonia.   Original Report Authenticated By: Rhodia Albright, M.D.    Dg Chest Port 1 View  02/21/2013  *RADIOLOGY REPORT*  Clinical Data: Intubation.  Respiratory failure.  PORTABLE CHEST - 1 VIEW  Comparison: 02/21/2013 at 6:37 a.m.  Findings: The inferior margin of the endotracheal tube is indistinct but believed to be 2 cm above the carina.  Right central line tip:  Lower SVC.  Worsened right basilar airspace opacity noted with new obscuration of the left hemidiaphragm compatible with left lower lobe airspace opacity.  Thoracic spondylosis is present along with atherosclerotic calcification of the thoracic aorta.  IMPRESSION:  1.  Endotracheal tube tip 2.0 cm above the carina. 2.  Worsening bibasilar airspace opacities, query atelectasis, pneumonia, or aspiration pneumonitis.   Original Report Authenticated By: Gaylyn Rong, M.D.    Dg Chest Port 1 View  02/21/2013  *RADIOLOGY REPORT*  Clinical Data: Respiratory failure.  PORTABLE CHEST - 1 VIEW  Comparison: 02/18/2013.  Findings: Elevation of the left hemidiaphragm has increased compared to prior exam.  New opacity over the right  hemidiaphragm is present, probably representing atelectasis.  The right upper extremity PICC is unchanged.  Tip is in the lower SVC. Cardiopericardial silhouette and mediastinal contours are unchanged. Monitoring leads are projected over the chest.  No pneumothorax.  Bilateral glenohumeral osteoarthritis.  IMPRESSION:  1.  Stable right upper extremity PICC. 2.  Lower lung volumes with new opacity at the right lung base favored to represent atelectasis based on decreasing volumes.   Original Report Authenticated By: Andreas Newport, M.D.    ABG    Component Value Date/Time   PHART 7.455* 02/21/2013 1150   PCO2ART 43.1 02/21/2013 1150   PO2ART 69.4* 02/21/2013 1150   HCO3 29.9* 02/21/2013 1150   TCO2 31.2 02/21/2013 1150   O2SAT 96.0 02/21/2013 1150     ASSESSMENT / PLAN: Acute on Chronic resp failure -  in 72 yo with chronic aspiration, volume overload, multiple HCAP. She has severe underlying COPD which is O2 dependent, Chronic aspiration with PEG placed 3/28 but is suspected on continued PO intake.  She has had lingering LLL atx vs infiltrate.  Concern for aspiration event 4/22 with pt drinking PEG flush.  Plan: -full vent support -cover for HCAP - defer abx to primary svc >> imipenem + vanco 4/23 -TF per nutrition - BD's. -follow CXR -consider gentle diuresis given pulm edema pattern CXR 4/24, discussed w [facility] -resp culture -PRN versed & fentanyl for sedation / pain >> adjust; meds played a role in her decompensation 4/23 -will need trach in setting of severe dysphagia; will discuss with Dr Molli Knock   Septic Shock - GNR bacteremia, speciation pending;  aspiration event, compounded by sedating medications; resolved 4/24 Plan: -levophed for goal MAP >65, weaned to off 4/23 pm -follow cultures for speciation GNR -add cipro to imipenem pending GNR speciation -consider d/c vanco IV 4/24 -agree w d/c enteral vanco 4/24 as repeat C diff negative -follow PCT -KVO IVF's 4/24 -d/c'd sedating  medications (multiple) -d/c'd BP meds (multiple)    DM Plan: -recommend d/c metformin and change to SSI  ARF - previous rise in sr. Cr with diuresis.  Resolved quickly, 4/24 Lab Results  Component Value Date   CREATININE 1.04 02/22/2013   CREATININE 1.41* 02/21/2013   CREATININE 1.15* 02/21/2013  Plan:  -monitor BMP, f/u in am -consider restart gentle diuresis (was on lasix until 4/23)   Severe Dysphagia Plan: -strict NPO -PEG feedings for nutrition   CC time 40 minutes  Levy Pupa, MD, PhD 02/22/2013, 10:33 AM [place] [facility] [phone] or if no answer [phone]

## 2013-02-23 ENCOUNTER — Other Ambulatory Visit (HOSPITAL_COMMUNITY): Payer: Self-pay

## 2013-02-23 ENCOUNTER — Encounter (HOSPITAL_COMMUNITY): Payer: Self-pay

## 2013-02-23 LAB — BLOOD GAS, ARTERIAL
MECHVT: 400 mL
Patient temperature: 98.6
RATE: 16 resp/min
TCO2: 31 mmol/L (ref 0–100)
pCO2 arterial: 42.2 mmHg (ref 35.0–45.0)
pH, Arterial: 7.462 — ABNORMAL HIGH (ref 7.350–7.450)

## 2013-02-23 LAB — BASIC METABOLIC PANEL
Chloride: 100 mEq/L (ref 96–112)
GFR calc non Af Amer: 62 mL/min — ABNORMAL LOW (ref >90)
Potassium: 4.4 mEq/L (ref 3.5–5.1)
Sodium: 137 mEq/L (ref 135–145)

## 2013-02-23 LAB — CBC
Platelets: 259 10*3/uL (ref 150–400)
RDW: 15.5 % (ref 11.5–15.5)
WBC: 22.2 10*3/uL — ABNORMAL HIGH (ref 4.0–10.5)

## 2013-02-23 LAB — URINE CULTURE: Colony Count: >=100000

## 2013-02-23 LAB — PROTIME-INR
INR: 1.03 (ref 0.00–1.49)
Prothrombin Time: 13.4 seconds (ref 11.6–15.2)

## 2013-02-23 LAB — PHOSPHORUS: Phosphorus: 1.8 mg/dL — ABNORMAL LOW (ref 2.3–4.6)

## 2013-02-23 NOTE — Procedures (Signed)
Bronchoscopy Procedure Note Elizabeth Maddox [id number] 02-01-1941  Procedure: Bronchoscopy Indications: Obtain specimens for culture and/or other diagnostic studies and to facilitate percutaneous trach placement  Procedure Details Consent: Risks of procedure as well as the alternatives and risks of each were explained to the (patient/caregiver).  Consent for procedure obtained. Time Out: Verified patient identification, verified procedure, site/side was marked, verified correct patient position, special equipment/implants available, medications/allergies/relevent history reviewed, required imaging and test results available.  Performed  In preparation for procedure, patient was given 100% FiO2 and bronchoscope lubricated. Sedation: Benzodiazepines, Etomidate and fenatnyl  FOB performed to facilitate perc trach by Dr Molli Knock. Please also refer to his procedure note for details on trach placement. Full visualization of needle cannulation, blue rhino dilation and trach placement. No evidence of injury to the posterior tracheal wall or of bleeding. The following bronchi were examined post-trach placement: RUL, RML, RLL, LUL and LLL.  All airways were clear. therer was yellow mucous eminating from the LLL airways.  Procedures performed: LLL bronchial washings sent for cx Bronchoscope removed.  , Patient placed back on 100% FiO2 at conclusion of procedure.    Evaluation Hemodynamic Status: transient hypertension, improved with increased sedation; O2 sats: stable throughout Patient's Current Condition: stable Specimens: LLL bronchial washings for cx Complications: No apparent complications Patient did tolerate procedure well.  Levy Pupa, MD, PhD 02/23/2013, 12:08 PM [place] [facility] [phone] or if no answer [phone]

## 2013-02-23 NOTE — Procedures (Signed)
Percutaneous Tracheostomy Placement   Consent from son, patient sedated and paralyzed, area cleaned, lidocaine/epi injected, skin incision done followed by blunt dissection, airway entered and visualized bronchoscopically, airway crushed and dilated, tracheostomy placed and wire removed. Visualized bronchoscopically well above carina. CXR ordered and pending. Patient tolerated and procedure well without complications. Size 6 cuffed tracheostomy placed.   Alyson Reedy, M.D.  [facility] Medicine.  Pager: [phone].  After hours pager: [phone].

## 2013-02-23 NOTE — Progress Notes (Signed)
Video bronchoscopy done with bedside trach insertion. Bronchial washing intervention

## 2013-02-23 NOTE — Procedures (Signed)
Bedside Tracheostomy Insertion Procedure Note   Patient Details:   Name: Elizabeth Maddox DOB: [birth year] MRN: [id number]  Procedure: Tracheostomy  Pre Procedure Assessment: Bite block in place: Yes Breath Sounds: Clear  Post Procedure Assessment: BP 161/72  Pulse 98  Resp 22  SpO2 95% O2 sats: stable throughout Complications: No apparent complications Patient did tolerate procedure well Tracheostomy Brand:Shiley Tracheostomy Style:Cuffed Tracheostomy Size: 6.0 Tracheostomy Secured WUJ:[id number] Tracheostomy Placement Confirmation:Trach cuff visualized and in place and Chest X ray ordered for placement    Leonard Downing 02/23/2013, 12:27 PM

## 2013-02-23 NOTE — Progress Notes (Signed)
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  SIGNIFICANT EVENTS / STUDIES:  4/10 pulmonary distress and OTT placed 4/23 - intubated for decreased mental status after episode of drinking NGT flush and choking, ABG relatively good, concern for aspiration & potential c-diff with diarrhea (although PCR negative on 4/10)  LINES / TUBES: 4/10 ott>>4/14 4/23 OTT >>  3/28 peg>> Picc ??>>  CULTURES: resp 3/25>>  e coli  3/31 BCx2>>>neg 4/10 C-diff >>>neg 4/10 Resp culture>>>neg  4/23 BCx2>>> GNR's >>  4/23 C-Diff>>> negative 4/23 UC>>> yeast 4/23 Resp>>>     ANTIBIOTICS: Meropenem 4/23 >>  vanco 4/23 >>  vanco per tube 4/23 (empiric) >> 4/24 cipro 4/24 (double cover GNR) >>    SUBJECTIVE:  Pressors off Awake and watching TV, agrees to trach  VITAL SIGNS: Vital signs reviewed. Abnormal values will appear under impression plan section.    PHYSICAL EXAMINATION: General:  Frail elderly female, awake on MV Neuro:  awake, interacts, follows commands HEENT: no pallor, icterus, JVD, ETT in place Neck:  No LAN Cardiovascular:  HSR RRR Lungs:   Decreased in bases Abdomen:  Peg in place, c/d/i Musculoskeletal:  Wasted musculature Skin:  warm   Recent Labs Lab 02/21/13 1225 02/22/13 0432 02/22/13 2049 02/23/13 0543  NA 137 139  --  137  K 3.5 3.2* 4.4 4.4  CL 95* 99  --  100  CO2 29 30  --  29  BUN 68* 46*  --  35*  CREATININE 1.41* 1.04  --  0.92  GLUCOSE 319* 131*  --  69*    Recent Labs Lab 02/21/13 0654 02/22/13 0430 02/23/13 0543  HGB 10.8* 8.0* 9.5*  HCT 31.5* 23.0* 27.3*  WBC 45.5* 31.3* 22.2*  PLT 344 272 259   Dg Chest Portable 1  View  02/23/2013  *RADIOLOGY REPORT*  Clinical Data: Evaluate endotracheal tube  PORTABLE CHEST - 1 VIEW  Comparison: 02/22/2013; 02/21/2013  Findings: Grossly unchanged enlarged cardiac silhouette and mediastinal contours with atherosclerotic calcifications within the thoracic aorta.  Pulmonary vasculature remains indistinct with cephalization of flow.  Suspected minimal increase in the small left-sided pleural effusion.  Grossly unchanged bibasilar heterogeneous opacities.  No new focal airspace opacity.  No definite pneumothorax.  Unchanged bones.  IMPRESSION: 1.  Stable positioning of support apparatus.  No pneumothorax. 2.  Persistent findings of pulmonary venous congestion with minimal increase in small left-sided pleural effusion. 3.  Grossly unchanged bibasilar opacities, atelectasis versus infiltrate.   Original Report Authenticated By: Tacey Ruiz, MD    Dg Chest Port 1 View  02/22/2013  *RADIOLOGY REPORT*  Clinical Data: Endotracheal tube assessment  PORTABLE CHEST - 1 VIEW  Comparison: 02/21/2013  Findings: The endotracheal tube tip is low positioned within 1 cm of the carina.  This can be pulled back up to 2 cm to allow positioning in the lower trachea.  The right subclavian CVP is stable in position.  Lower lung volumes are identified with increasing atelectasis at both lung bases and new pulmonary vascular congestion.  An area of focal infiltrate at the right lung base is seen and has a rounded morphology suspicious for an area of focal pneumonia.  This is  unchanged in comparison with prior exam. No pleural fluid or overt congestive failure is seen. No new infiltrates noted.  IMPRESSION: Low endotracheal tube position as noted above.  Increasing pulmonary vascular congestion and unchanged right lower lobe infiltrate suspicious for an area of focal pneumonia.   Original Report Authenticated By: Rhodia Albright, M.D.    Dg Chest Port 1 View  02/21/2013  *RADIOLOGY REPORT*  Clinical Data:  Intubation.  Respiratory failure.  PORTABLE CHEST - 1 VIEW  Comparison: 02/21/2013 at 6:37 a.m.  Findings: The inferior margin of the endotracheal tube is indistinct but believed to be 2 cm above the carina.  Right central line tip:  Lower SVC.  Worsened right basilar airspace opacity noted with new obscuration of the left hemidiaphragm compatible with left lower lobe airspace opacity.  Thoracic spondylosis is present along with atherosclerotic calcification of the thoracic aorta.  IMPRESSION:  1.  Endotracheal tube tip 2.0 cm above the carina. 2.  Worsening bibasilar airspace opacities, query atelectasis, pneumonia, or aspiration pneumonitis.   Original Report Authenticated By: Gaylyn Rong, M.D.    ABG    Component Value Date/Time   PHART 7.462* 02/23/2013 0425   PCO2ART 42.2 02/23/2013 0425   PO2ART 78.6* 02/23/2013 0425   HCO3 29.7* 02/23/2013 0425   TCO2 31.0 02/23/2013 0425   O2SAT 96.9 02/23/2013 0425     ASSESSMENT / PLAN: Acute on Chronic resp failure -  in 72 yo with chronic aspiration, volume overload, multiple HCAP's. She has severe underlying COPD which is O2 dependent, Chronic aspiration with PEG placed 3/28 but suspected continued PO intake.  She has had lingering LLL atx vs infiltrate.  Concern for aspiration event 4/22 after she reportedly drank PEG flush.   Plan: -full vent support -cover for HCAP - defer abx to primary svc >> imipenem + vanco 4/23, vanco d/c 4/24 -TF per nutrition - BD's. -follow CXR -consider gentle diuresis given pulm edema pattern CXR 4/24, discussed w [facility] -resp culture pending -PRN versed & fentanyl for sedation / pain >> adjust; meds played a role in her decompensation 4/23 -will need trach in setting of severe dysphagia; discussed with Dr Molli Knock, planning for 4/25  Septic Shock - GNR bacteremia, speciation pending;  aspiration event, compounded by sedating medications; resolved 4/24  Recent Labs Lab 02/21/13 1225 02/22/13 0432 02/23/13 0543   PROCALCITON 6.51 3.96 1.32   Plan: -levophed for goal MAP >65, weaned to off 4/23 pm -follow cultures for speciation GNR -added cipro to imipenem 4/24 pending GNR speciation -d/c vanco IV 4/24 -d/c enteral vanco 4/24 as repeat C diff negative -follow PCT -KVO IVF's 4/24 -d/c'd sedating medications (multiple) -d/c'd BP meds (multiple)    DM Plan: -recommend d/c metformin and change to SSI  ARF - previous rise in sr. Cr with diuresis.  Resolved quickly, 4/24 Lab Results  Component Value Date   CREATININE 0.92 02/23/2013   CREATININE 1.04 02/22/2013   CREATININE 1.41* 02/21/2013  Plan:  -monitor BMP, f/u in am -gentle diuresis (was on lasix until 4/23)   Severe Dysphagia Plan: -strict NPO -PEG feedings for nutrition   CC time 40 minutes  Levy Pupa, MD, PhD 02/23/2013, 11:38 AM [place] [facility] [phone] or if no answer [phone]

## 2013-02-24 DIAGNOSIS — A4159 Other Gram-negative sepsis: Secondary | ICD-10-CM | POA: Diagnosis not present

## 2013-02-24 LAB — BASIC METABOLIC PANEL
Calcium: 10 mg/dL (ref 8.4–10.5)
Chloride: 94 mEq/L — ABNORMAL LOW (ref 96–112)
Creatinine, Ser: 1.17 mg/dL — ABNORMAL HIGH (ref 0.50–1.10)
GFR calc Af Amer: 53 mL/min — ABNORMAL LOW (ref >90)
Sodium: 133 mEq/L — ABNORMAL LOW (ref 135–145)

## 2013-02-24 LAB — VANCOMYCIN, TROUGH: Vancomycin Tr: 7.1 ug/mL — ABNORMAL LOW (ref 10.0–20.0)

## 2013-02-24 NOTE — Progress Notes (Addendum)
PULMONARY  / CRITICAL CARE MEDICINE  Name: Elizabeth Maddox MRN: [id number] DOB: July 05, 1941    ADMISSION DATE:  Jan 17, 2013 CONSULTATION DATE: 4-10  REFERRING MD :  [facility] PRIMARY SERVICE:  [facility]  CHIEF COMPLAINT:  VDRF  BRIEF PATIENT DESCRIPTION:  Frail elderly WF with chronic aspiration due to esophageal dysmotility, O2 dependent COPD intubated 4-10.  SIGNIFICANT EVENTS / STUDIES:  4/10 pulmonary distress and OTT placed 4/23 - intubated for decreased mental status after episode of drinking NGT flush and choking, ABG relatively good, concern for aspiration & potential c-diff with diarrhea (although PCR negative on 4/10)  LINES / TUBES: 4/10 ott>>4/14 4/23 OTT >>  3/28 peg>> Picc ??>>  CULTURES: resp 3/25>>  e coli  3/31 BCx2>>>neg 4/10 C-diff >>>neg 4/10 Resp culture>>>neg  4/23 BCx2>>> GNR's >> acinetobacter  4/23 C-Diff>>> negative 4/23 UC>>> yeast 4/23 Resp>>> acinetobacter   ANTIBIOTICS: Meropenem 4/23 >>  vanco 4/23 >> 4/25 vanco per tube 4/23 (empiric) >> 4/24 cipro 4/24 (double cover GNR) >> 4/26   SUBJECTIVE:  C/o mild dyspnea Awake, otherwise comfortable  VITAL SIGNS: Vital signs reviewed. Abnormal values will appear under impression plan section.    PHYSICAL EXAMINATION: General:  Frail elderly female, awake on MV Neuro:  awake, interacts, follows commands HEENT: no pallor, icterus, JVD, trach in place with some blood on gauze, no apparent active bleeding Neck:  No LAN Cardiovascular:  HSR RRR Lungs:   Decreased in bases Abdomen:  Peg in place, c/d/i Musculoskeletal:  Wasted musculature Skin:  warm   Recent Labs Lab 02/22/13 0432 02/22/13 2049 02/23/13 0543 02/24/13 0625  NA 139  --  137 133*  K 3.2* 4.4 4.4 4.7  CL 99  --  100 94*  CO2 30  --  29 29  BUN 46*  --  35* 34*  CREATININE 1.04  --  0.92 1.17*  GLUCOSE 131*  --  69* 148*    Recent Labs Lab 02/21/13 0654 02/22/13 0430 02/23/13 0543  HGB 10.8* 8.0* 9.5*  HCT 31.5* 23.0*  27.3*  WBC 45.5* 31.3* 22.2*  PLT 344 272 259   Dg Chest Portable 1 View  02/23/2013  *RADIOLOGY REPORT*  Clinical Data: Tracheostomy tube placement.  PORTABLE CHEST - 1 VIEW  Comparison: 02/23/2013  Findings: A new tracheostomy tube is present and projects over the tracheal air shadow.  No significant pneumomediastinum.  A right- sided PICC line projects over the lower SVC.  Airspace opacity obscures the left hemidiaphragm.  There is increased airspace opacity in the left upper lobe and continued airspace opacity at the right lung base and in the right perihilar region.  Thoracic spondylosis noted with aortic atherosclerotic calcification.  IMPRESSION:  1.  Tracheostomy tube appears satisfactorily positioned. 2.  Increased airspace opacity in the left upper lobe with persistent dense airspace opacity in the left lower lobe and at the right lung base.   Original Report Authenticated By: Gaylyn Rong, M.D.    Dg Chest Portable 1 View  02/23/2013  *RADIOLOGY REPORT*  Clinical Data: Evaluate endotracheal tube  PORTABLE CHEST - 1 VIEW  Comparison: 02/22/2013; 02/21/2013  Findings: Grossly unchanged enlarged cardiac silhouette and mediastinal contours with atherosclerotic calcifications within the thoracic aorta.  Pulmonary vasculature remains indistinct with cephalization of flow.  Suspected minimal increase in the small left-sided pleural effusion.  Grossly unchanged bibasilar heterogeneous opacities.  No new focal airspace opacity.  No definite pneumothorax.  Unchanged bones.  IMPRESSION: 1.  Stable positioning of support apparatus.  No pneumothorax.  2.  Persistent findings of pulmonary venous congestion with minimal increase in small left-sided pleural effusion. 3.  Grossly unchanged bibasilar opacities, atelectasis versus infiltrate.   Original Report Authenticated By: Tacey Ruiz, MD    ABG    Component Value Date/Time   PHART 7.462* 02/23/2013 0425   PCO2ART 42.2 02/23/2013 0425   PO2ART 78.6*  02/23/2013 0425   HCO3 29.7* 02/23/2013 0425   TCO2 31.0 02/23/2013 0425   O2SAT 96.9 02/23/2013 0425     ASSESSMENT / PLAN: Acute on Chronic resp failure -  in 72 yo with chronic aspiration, volume overload, multiple HCAP's. She has severe underlying COPD which is O2 dependent, Chronic aspiration with PEG placed 3/28 but suspected continued PO intake.  She has had lingering LLL atx vs infiltrate.  Concern for aspiration event 4/22 after she reportedly drank PEG flush. B infiltrates > reintubated. S/p trach on 4/25 Plan: -full vent support, will continue low Vt vent strategy, may need to consider more sedation for her to tolerate well -cover for HCAP -TF per nutrition - BD's. -follow CXR -consider gentle diuresis given pulm edema pattern CXR 4/24, discussed w [facility] -resp culture pending -PRN versed & fentanyl -follow trach site for bleeding, appears to have hemostasis at this time.   Septic Shock - GNR bacteremia, speciation pending;  aspiration event, compounded by sedating medications; resolved 4/24 norepi weaned to off Plan: -added cipro to imipenem 4/24, speciated as acinetobacter, continue double coverage until sensitivities obtained -d/c'd vanco IV 4/24 -d/c'd enteral vanco 4/24 as repeat C diff negative -KVO IVF's 4/24 -d/c'd sedating medications (multiple) -d/c'd BP meds (multiple)    DM Plan: -SSI  ARF - previous rise in sr. Cr with diuresis.  Resolved quickly, 4/24 Lab Results  Component Value Date   CREATININE 1.17* 02/24/2013   CREATININE 0.92 02/23/2013   CREATININE 1.04 02/22/2013  Plan:  -monitor BMP, f/u in am -gentle diuresis (was on lasix until 4/23)   Severe Dysphagia Plan: -strict NPO -PEG feedings for nutrition   CC time 30 minutes  Levy Pupa, MD, PhD 02/24/2013, 11:42 AM [place] [facility] [phone] or if no answer [phone]

## 2013-02-24 NOTE — Progress Notes (Signed)
Levy Pupa, MD, PhD 02/24/2013, 11:42 AM [place] [facility] [phone] or if no answer [phone]

## 2013-02-25 LAB — CULTURE, BAL-QUANTITATIVE W GRAM STAIN: Colony Count: >=100000

## 2013-02-26 ENCOUNTER — Other Ambulatory Visit (HOSPITAL_COMMUNITY): Payer: Self-pay

## 2013-02-26 DIAGNOSIS — J189 Pneumonia, unspecified organism: Secondary | ICD-10-CM

## 2013-02-26 DIAGNOSIS — Z430 Encounter for attention to tracheostomy: Secondary | ICD-10-CM

## 2013-02-26 DIAGNOSIS — R7881 Bacteremia: Secondary | ICD-10-CM

## 2013-02-26 LAB — BLOOD GAS, ARTERIAL
Bicarbonate: 25.4 mEq/L — ABNORMAL HIGH (ref 20.0–24.0)
O2 Saturation: 96.2 %
PEEP: 5 cmH2O
Patient temperature: 98.6
RATE: 16 resp/min
pH, Arterial: 7.426 (ref 7.350–7.450)
pO2, Arterial: 74.1 mmHg — ABNORMAL LOW (ref 80.0–100.0)

## 2013-02-26 LAB — BASIC METABOLIC PANEL
Calcium: 10.1 mg/dL (ref 8.4–10.5)
Creatinine, Ser: 1.67 mg/dL — ABNORMAL HIGH (ref 0.50–1.10)
GFR calc Af Amer: 35 mL/min — ABNORMAL LOW (ref >90)
Sodium: 127 mEq/L — ABNORMAL LOW (ref 135–145)

## 2013-02-26 LAB — MAGNESIUM: Magnesium: 2.4 mg/dL (ref 1.5–2.5)

## 2013-02-26 NOTE — Consult Note (Signed)
[facility]    Date of Admission:  01/14/2013  Date of Consult:  02/26/2013  Reason for Consult: MDR ACINETOBACTER complex  Pneumonia and bacteremia Referring Physician: Dr. Stanton Kidney   HPI: Elizabeth Maddox is an 72 y.o. female. With PMHX significant chronic aspiration due to esophageal dysmotility, O2 dependent COPD   recently hospitalization at [facility] with intubation on 02/08/13 given mx rounds of antibiotics, most recently merrem, vanco, cipro who has now developed MDR Acinetobacter Baumani VAP with bacteremia. She has had tracheostomy on 02/23/13 by CCM who are also following the pt closely.      Medications: I have reviewed patients current medications as documented in [facility]  Social History:  has no tobacco, alcohol, and drug history on file.  No family history on file.  As in HPI and primary teams notes otherwise 12 point review of systems is negative  Blood pressure 161/72, pulse 98, resp. rate 22, SpO2 95.00%. General: Alert and awake, oriented x3, anxious  HEENT: anicteric sclera, pupils reactive to light and accommodation,  gas and the site has some dried blood  CVS regular rate, normal r,  no murmur rubs or gallops Chest: Coarse breath sounds throughout  Abdomen: soft nontender, nondistended, normal bowel sounds, Extremities: no  clubbing or edema noted bilaterally Skin: no rashes Neuro: nonfocal, strength and sensation intact   Results for orders placed during the hospital encounter of 01/23/2013 (from the past 48 hour(s))  BASIC METABOLIC PANEL     Status: Abnormal   Collection Time    02/26/13  6:45 AM      Result Value Range   Sodium 127 (*) 135 - 145 mEq/L   Potassium 5.7 (*) 3.5 - 5.1 mEq/L   Chloride 91 (*) 96 - 112 mEq/L   CO2 25  19 - 32 mEq/L   Glucose, Bld 117 (*) 70 - 99 mg/dL   BUN 56 (*) 6 - 23 mg/dL   Creatinine, Ser 1.91 (*) 0.50 - 1.10 mg/dL   Calcium 47.8  8.4 - 29.5 mg/dL   GFR calc non Af Amer 30 (*) >90 mL/min   GFR calc Af Amer 35 (*) >90 mL/min   Comment:            The eGFR has been calculated     using the CKD EPI equation.     This calculation has not been     validated in all clinical     situations.     eGFR's persistently     <90 mL/min signify     possible Chronic Kidney Disease.  MAGNESIUM     Status: None   Collection Time    02/26/13  6:45 AM      Result Value Range   Magnesium 2.4  1.5 - 2.5 mg/dL  BLOOD GAS, ARTERIAL     Status: Abnormal   Collection Time    02/26/13  6:30 PM      Result Value Range   FIO2 0.40     Delivery systems VENTILATOR     Mode ASSIST CONTROL     VT 0.400     Rate 16.0     Peep/cpap 5.0     pH, Arterial 7.426  7.350 - 7.450   pCO2 arterial 39.3  35.0 - 45.0 mmHg   pO2, Arterial 74.1 (*) 80.0 - 100.0 mmHg   Bicarbonate 25.4 (*) 20.0 - 24.0 mEq/L   TCO2 26.6  0 - 100 mmol/L   Acid-Base Excess  1.5  0.0 - 2.0 mmol/L   O2 Saturation 96.2     Patient temperature 98.6     Collection site LEFT RADIAL     Drawn by COLLECTED BY RT     Sample type ARTERIAL DRAW     Allens test (pass/fail) PASS  PASS      Component Value Date/Time   SDES BRONCHIAL ALVEOLAR LAVAGE 02/23/2013 1405   SPECREQUEST NONE 02/23/2013 1405   CULT  Value: ACINETOBACTER CALCOACETICUS/BAUMANNII COMPLEX Note: SUSCEPTIBILITIES PERFORMED ON PREVIOUS CULTURE WITHIN THE LAST 5 DAYS. 02/23/2013 1405   REPTSTATUS 02/25/2013 FINAL 02/23/2013 1405   No results found.   Recent Results (from the past 720 hour(s))  CULTURE, BLOOD (ROUTINE X 2)     Status: None   Collection Time    01/29/13 11:38 AM      Result Value Range Status   Specimen Description BLOOD RIGHT HAND   Final   Special Requests BOTTLES DRAWN AEROBIC AND ANAEROBIC 10CC   Final   Culture  Setup Time 01/29/2013 21:15   Final   Culture NO GROWTH 5 DAYS   Final   Report Status 02/04/2013 FINAL   Final  CULTURE, BLOOD (ROUTINE X 2)     Status: None   Collection Time    01/29/13 11:45 AM      Result Value Range Status    Specimen Description BLOOD LEFT HAND   Final   Special Requests BOTTLES DRAWN AEROBIC AND ANAEROBIC 10CC   Final   Culture  Setup Time 01/29/2013 21:15   Final   Culture NO GROWTH 5 DAYS   Final   Report Status 02/04/2013 FINAL   Final  URINE CULTURE     Status: None   Collection Time    01/29/13 10:28 PM      Result Value Range Status   Specimen Description URINE, RANDOM   Final   Special Requests NONE   Final   Culture  Setup Time 01/29/2013 23:32   Final   Colony Count NO GROWTH   Final   Culture NO GROWTH   Final   Report Status 01/31/2013 FINAL   Final  CULTURE, EXPECTORATED SPUTUM-ASSESSMENT     Status: None   Collection Time    01/30/13 12:32 AM      Result Value Range Status   Specimen Description SPUTUM   Final   Special Requests NONE   Final   Sputum evaluation     Final   Value: THIS SPECIMEN IS ACCEPTABLE. RESPIRATORY CULTURE REPORT TO FOLLOW.   Report Status 01/30/2013 FINAL   Final  CULTURE, RESPIRATORY (NON-EXPECTORATED)     Status: None   Collection Time    01/30/13 12:32 AM      Result Value Range Status   Specimen Description SPUTUM   Final   Special Requests NONE   Final   Gram Stain     Final   Value: RARE WBC PRESENT, PREDOMINANTLY PMN     RARE SQUAMOUS EPITHELIAL CELLS PRESENT     NO ORGANISMS SEEN   Culture NORMAL OROPHARYNGEAL FLORA   Final   Report Status 02/01/2013 FINAL   Final  URINE CULTURE     Status: None   Collection Time    02/05/13 10:39 AM      Result Value Range Status   Specimen Description URINE, RANDOM   Final   Special Requests Normal   Final   Culture  Setup Time 02/05/2013 12:07   Final   Colony Count NO  GROWTH   Final   Culture NO GROWTH   Final   Report Status 02/06/2013 FINAL   Final  CULTURE, EXPECTORATED SPUTUM-ASSESSMENT     Status: None   Collection Time    02/08/13 12:32 PM      Result Value Range Status   Specimen Description SPUTUM   Final   Special Requests Normal   Final   Sputum evaluation     Final   Value:  THIS SPECIMEN IS ACCEPTABLE. RESPIRATORY CULTURE REPORT TO FOLLOW.   Report Status 02/08/2013 FINAL   Final  CULTURE, RESPIRATORY (NON-EXPECTORATED)     Status: None   Collection Time    02/08/13 12:32 PM      Result Value Range Status   Specimen Description SPUTUM   Final   Special Requests NONE   Final   Gram Stain     Final   Value: MODERATE WBC PRESENT,BOTH PMN AND MONONUCLEAR     NO SQUAMOUS EPITHELIAL CELLS SEEN     NO ORGANISMS SEEN   Culture NO GROWTH 2 DAYS   Final   Report Status 02/10/2013 FINAL   Final  CLOSTRIDIUM DIFFICILE BY PCR     Status: None   Collection Time    02/08/13 11:34 PM      Result Value Range Status   C difficile by pcr NEGATIVE  NEGATIVE Final  CLOSTRIDIUM DIFFICILE BY PCR     Status: None   Collection Time    02/21/13  9:55 AM      Result Value Range Status   C difficile by pcr NEGATIVE  NEGATIVE Final  CULTURE, RESPIRATORY (NON-EXPECTORATED)     Status: None   Collection Time    02/21/13  9:58 AM      Result Value Range Status   Specimen Description TRACHEAL ASPIRATE   Final   Special Requests NONE   Final   Gram Stain     Final   Value: FEW WBC PRESENT, PREDOMINANTLY PMN     NO SQUAMOUS EPITHELIAL CELLS SEEN     ABUNDANT GRAM NEGATIVE RODS   Culture     Final   Value: ABUNDANT ACINETOBACTER CALCOACETICUS/BAUMANNII COMPLEX     Note: COLISTIN  0.25ug/mL  SENSITIVE ETEST results for this drug are "FOR INVESTIGATIONAL USE ONLY" and should NOT be used for clinical purposes.     Note: MULTI DRUG RESISTANT ORGANISM CRITICAL RESULT CALLED TO, READ BACK BY AND VERIFIED WITH: MONTAGUE RN [id number] 02/25/13 GUSTK   Report Status 02/25/2013 FINAL   Final   Organism ID, Bacteria ACINETOBACTER CALCOACETICUS/BAUMANNII COMPLEX   Final  CULTURE, BLOOD (ROUTINE X 2)     Status: None   Collection Time    02/21/13 10:10 AM      Result Value Range Status   Specimen Description BLOOD FOREARM RIGHT   Final   Special Requests BOTTLES DRAWN AEROBIC ONLY [facility]   Final     Culture  Setup Time 02/21/2013 14:03   Final   Culture     Final   Value: ACINETOBACTER CALCOACETICUS/BAUMANNII COMPLEX     Note: COLISTIN = 0.25ug/ml (SENSITIVE) ETEST results for this drug are "FOR INVESTIGATIONAL USE ONLY" and should NOT be used for clinical purposes. MULTI DRUG RESISTANT CRITICAL RESULT CALLED TO, READ BACK BY AND VERIFIED WITH: JOSEPH FURGUAY 02/26/13      1005 BY SMITHERSJ     Note: Gram Stain Report Called to,Read Back By and Verified With: OLOFINTUYI AT 0408 02/22/13 BY SNOLO   Report Status  02/26/2013 FINAL   Final   Organism ID, Bacteria ACINETOBACTER CALCOACETICUS/BAUMANNII COMPLEX   Final  CULTURE, BLOOD (ROUTINE X 2)     Status: None   Collection Time    02/21/13 10:15 AM      Result Value Range Status   Specimen Description BLOOD HAND RIGHT   Final   Special Requests BOTTLES DRAWN AEROBIC ONLY 8CC   Final   Culture  Setup Time 02/21/2013 14:03   Final   Culture     Final   Value: ACINETOBACTER CALCOACETICUS/BAUMANNII COMPLEX     Note: SUSCEPTIBILITIES PERFORMED ON PREVIOUS CULTURE WITHIN THE LAST 5 DAYS.     Note: CRITICAL RESULT CALLED TO, READ BACK BY AND VERIFIED WITH: OLOFINTUYI AT 0408 02/22/13 BY SNOLO   Report Status 02/26/2013 FINAL   Final  URINE CULTURE     Status: None   Collection Time    02/21/13 11:15 AM      Result Value Range Status   Specimen Description URINE, RANDOM   Final   Special Requests Normal   Final   Culture  Setup Time 02/21/2013 14:17   Final   Colony Count >=100,000 COLONIES/ML   Final   Culture YEAST   Final   Report Status 02/22/2013 FINAL   Final  URINE CULTURE     Status: None   Collection Time    02/21/13  4:06 PM      Result Value Range Status   Specimen Description URINE, RANDOM   Final   Special Requests Normal   Final   Culture  Setup Time 02/22/2013 04:18   Final   Colony Count >=100,000 COLONIES/ML   Final   Culture YEAST   Final   Report Status 02/23/2013 FINAL   Final  CULTURE, BAL-QUANTITATIVE      Status: None   Collection Time    02/23/13  2:05 PM      Result Value Range Status   Specimen Description BRONCHIAL ALVEOLAR LAVAGE   Final   Special Requests NONE   Final   Gram Stain     Final   Value: MODERATE WBC PRESENT, PREDOMINANTLY PMN     NO SQUAMOUS EPITHELIAL CELLS SEEN     MODERATE GRAM NEGATIVE RODS   Colony Count >=100,000 COLONIES/ML   Final   Culture     Final   Value: ACINETOBACTER CALCOACETICUS/BAUMANNII COMPLEX     Note: SUSCEPTIBILITIES PERFORMED ON PREVIOUS CULTURE WITHIN THE LAST 5 DAYS.   Report Status 02/25/2013 FINAL   Final     Impression/Recommendation  72 yo with COPD, chronic aspiration recent solicitation [facility] having been given multiple courses of antibiotics for healthcare associated pneumonia, ventilator dependent respiratory failure status post tracheostomy now found to have MDR  Acinetobacter  pneumonia and bacteremia  #1 MDR Acinetobacter PNA and bacteremia:  --start colistin IV dosed per pharamacy --start high dose rifampin 300mg  tid --ask for tygacil sensis (unlikely to be active)  --PULL all central lines and cutlure cath tips  I have counselled the pt and her son as to risk of nephrotoxicity of colistin but pt is accepting of this risk as is her son  I spent greater than 60 minutes with the patient including greater than 50% of time in face to face counsel of the patient and in coordination of their care.      Thank you so much for this interesting consult  [facility] [phone] (pager) [phone] (office) 02/26/2013,  6:34 PM  Elizabeth Maddox 02/26/2013, 6:34 PM

## 2013-02-26 NOTE — Procedures (Signed)
Called secondary to bleeding trach  Placed pt supine Etomidate utilized Removed extensive old clot. plaed surgiseal all sides Removed sutures Cleaned area 1 hr additional direct pt care  Mcarthur Rossetti. Tyson Alias, MD, FACP Pgr: [phone] [facility]

## 2013-02-26 NOTE — Progress Notes (Addendum)
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  SIGNIFICANT EVENTS / STUDIES:  4/10 pulmonary distress and OTT placed 4/23 - intubated for decreased mental status after episode of drinking NGT flush and choking, ABG relatively good, concern for aspiration & potential c-diff with diarrhea (although PCR negative on 4/10) 4-28 bleeding from trach site LINES / TUBES: 4/10 ott>>4/14 4/23 OTT >> 4-26 3/28 peg>> Picc ??>> 4-26 trach>> CULTURES: resp 3/25>>  e coli  3/31 BCx2>>>neg 4/10 C-diff >>>neg 4/10 Resp culture>>>neg  4/23 BCx2>>> GNR's >> acinetobacter  4/23 C-Diff>>> negative 4/23 UC>>> yeast 4/23 Resp>>> acinetobacter   ANTIBIOTICS: Meropenem 4/23 >>  vanco 4/23 >> 4/25 vanco per tube 4/23 (empiric) >> 4/24 cipro 4/24 (double cover GNR) >> 4/26   SUBJECTIVE:  C/o mild dyspnea Awake, otherwise comfortable  VITAL SIGNS: Vital signs reviewed. Abnormal values will appear under impression plan section.    PHYSICAL EXAMINATION: General:  Frail elderly female, awake on MV Neuro:  awake, interacts, follows commands HEENT: no pallor, icterus, JVD, trach in place with copious bleeding from trach site Neck:  No LAN Cardiovascular:  HSR RRR Lungs:   Decreased in bases Abdomen:  Peg in place, c/d/i Musculoskeletal:  Wasted musculature Skin:  warm   Recent Labs Lab 02/23/13 0543 02/24/13 0625 02/26/13 0645  NA 137 133* 127*  K 4.4 4.7 5.7*  CL 100 94* 91*  CO2 29 29 25   BUN 35* 34* 56*  CREATININE 0.92 1.17* 1.67*  GLUCOSE 69* 148* 117*    Recent Labs Lab 02/21/13 0654 02/22/13 0430 02/23/13 0543  HGB 10.8* 8.0* 9.5*  HCT 31.5* 23.0* 27.3*  WBC 45.5*  31.3* 22.2*  PLT 344 272 259   No results found. ABG    Component Value Date/Time   PHART 7.462* 02/23/2013 0425   PCO2ART 42.2 02/23/2013 0425   PO2ART 78.6* 02/23/2013 0425   HCO3 29.7* 02/23/2013 0425   TCO2 31.0 02/23/2013 0425   O2SAT 96.9 02/23/2013 0425     ASSESSMENT / PLAN: Acute on Chronic resp failure -  in 72 yo with chronic aspiration, volume overload, multiple HCAP's. She has severe underlying COPD which is O2 dependent, Chronic aspiration with PEG placed 3/28 but suspected continued PO intake.  She has had lingering LLL atx vs infiltrate.  Concern for aspiration event 4/22 after she reportedly drank PEG flush. B infiltrates > reintubated. S/p trach on 4/25 4-28 has continued to bleed via trach. Plan: -full vent support, will continue low Vt vent strategy, may need to consider more sedation for her to tolerate well -covered for HCAP, eval PCXR now for aspiration  -TF per nutrition - BD's. -follow CXR -resp culture pending -PRN versed & fentanyl abg to assess ensure correct ph with K rising   4-28 have ordered ENT tray to bedside plus 1 in kling for packing. Consider adding gelfoam or thrombin. Consider heavy sedation x 48 hrs with ? NMB to allow clotting. Septic Shock - GNR bacteremia, ACINETOBACTER CALCOACETICUS/BAUMANNII COMPLEX  PAN resitant  aspiration event, compounded by sedating medications; resolved 4/24 norepi weaned to off Plan: -Shock resolved 4-28 May need to take trach out and place ETT , will eval hemostasis with surgisal Uremia, add  ddavp  DM Plan: -SSI  ARF - previous rise in sr. Cr with diuresis.  Resolved quickly, 4/24 Lab Results  Component Value Date   CREATININE 1.67* 02/26/2013   CREATININE 1.17* 02/24/2013   CREATININE 0.92 02/23/2013  Plan:  -consider holding diuresis, K per primary Will ensure corrected ph with k and discuss Select primary plan, kayex etc  Severe Dysphagia Plan: -strict NPO -PEG feedings for nutrition  Pan res  bacteremia Ensure no central access ID consult ASAP  Ccm time 30 min  I have fully examined this patient and agree with above findings.    And edited in full  Mcarthur Rossetti. Tyson Alias, MD, FACP Pgr: [phone] [facility]   Brett Canales Minor ACNP [facility] PCCM Pager [phone] till 3 pm If no answer page [phone] 02/26/2013, 11:38 AM

## 2013-02-27 ENCOUNTER — Other Ambulatory Visit (HOSPITAL_COMMUNITY): Payer: Self-pay

## 2013-02-27 LAB — CBC WITH DIFFERENTIAL/PLATELET
Basophils Relative: 0 % (ref 0–1)
Eosinophils Absolute: 0.5 10*3/uL (ref 0.0–0.7)
Lymphs Abs: 1.7 10*3/uL (ref 0.7–4.0)
MCH: 30.1 pg (ref 26.0–34.0)
MCHC: 35.6 g/dL (ref 30.0–36.0)
Monocytes Absolute: 1.2 10*3/uL — ABNORMAL HIGH (ref 0.1–1.0)
Monocytes Relative: 8 % (ref 3–12)
WBC: 14.3 10*3/uL — ABNORMAL HIGH (ref 4.0–10.5)

## 2013-02-27 LAB — BASIC METABOLIC PANEL
Calcium: 9.2 mg/dL (ref 8.4–10.5)
Creatinine, Ser: 1.25 mg/dL — ABNORMAL HIGH (ref 0.50–1.10)
GFR calc Af Amer: 49 mL/min — ABNORMAL LOW (ref >90)
GFR calc non Af Amer: 43 mL/min — ABNORMAL LOW (ref >90)

## 2013-02-27 LAB — URINALYSIS, ROUTINE W REFLEX MICROSCOPIC
Ketones, ur: NEGATIVE mg/dL
Nitrite: NEGATIVE
Protein, ur: 30 mg/dL — AB
pH: 6 (ref 5.0–8.0)

## 2013-02-27 LAB — URINE MICROSCOPIC-ADD ON

## 2013-02-27 NOTE — Progress Notes (Signed)
[facility]  Day # 2 colistin 150mg  IV q 8 hours Day #2 rifampin 300mg  q 8 hours  Subjective: No new complaints   Antibiotics:  Anti-infectives   None      Medications: Scheduled Meds: Continuous Infusions: PRN Meds:.     Objective: Weight change:  No intake or output data in the 24 hours ending 02/27/13 1555 Blood pressure 161/72, pulse 98, resp. rate 22, SpO2 95.00%.    Physical Exam: General: Alert and awake, oriented x3, anxious  HEENT: anicteric sclera, pupils reactive to light and accommodation, trachea site less blood today CVS regular rate, normal r, no murmur rubs or gallops  Chest: Coarse breath sounds throughout  Abdomen: soft nontender, nondistended, normal bowel sounds,  Extremities: no clubbing or edema noted bilaterally  Skin: no rashes  Neuro: nonfocal, strength and sensation intact   Lab Results:  Recent Labs  02/27/13 0600  WBC 14.3*  HGB 9.6*  HCT 27.0*  PLT 266    BMET  Recent Labs  02/26/13 0645 02/27/13 0600  NA 127* 133*  K 5.7* 3.6  CL 91* 95*  CO2 25 29  GLUCOSE 117* 134*  BUN 56* 49*  CREATININE 1.67* 1.25*  CALCIUM 10.1 9.2    Micro Results: Recent Results (from the past 240 hour(s))  CLOSTRIDIUM DIFFICILE BY PCR     Status: None   Collection Time    02/21/13  9:55 AM      Result Value Range Status   C difficile by pcr NEGATIVE  NEGATIVE Final  CULTURE, RESPIRATORY (NON-EXPECTORATED)     Status: None   Collection Time    02/21/13  9:58 AM      Result Value Range Status   Specimen Description TRACHEAL ASPIRATE   Final   Special Requests NONE   Final   Gram Stain     Final   Value: FEW WBC PRESENT, PREDOMINANTLY PMN     NO SQUAMOUS EPITHELIAL CELLS SEEN     ABUNDANT GRAM NEGATIVE RODS   Culture     Final   Value: ABUNDANT ACINETOBACTER CALCOACETICUS/BAUMANNII COMPLEX     Note: COLISTIN  0.25ug/mL  SENSITIVE ETEST results for this drug are "FOR INVESTIGATIONAL USE ONLY" and  should NOT be used for clinical purposes. TIGECYCLINE MIC IS >=4 RESISTANT     Note: MULTI DRUG RESISTANT ORGANISM CRITICAL RESULT CALLED TO, READ BACK BY AND VERIFIED WITH: MONTAGUE RN [id number] 02/25/13 GUSTK   Report Status 02/27/2013 FINAL   Final   Organism ID, Bacteria ACINETOBACTER CALCOACETICUS/BAUMANNII COMPLEX   Final  CULTURE, BLOOD (ROUTINE X 2)     Status: None   Collection Time    02/21/13 10:10 AM      Result Value Range Status   Specimen Description BLOOD FOREARM RIGHT   Final   Special Requests BOTTLES DRAWN AEROBIC ONLY [facility]   Final   Culture  Setup Time 02/21/2013 14:03   Final   Culture     Final   Value: ACINETOBACTER CALCOACETICUS/BAUMANNII COMPLEX     Note: COLISTIN = 0.25ug/ml (SENSITIVE) ETEST results for this drug are "FOR INVESTIGATIONAL USE ONLY" and should NOT be used for clinical purposes. MULTI DRUG RESISTANT CRITICAL RESULT CALLED TO, READ BACK BY AND VERIFIED WITH: JOSEPH FURGUAY 02/26/13      1005 BY SMITHERSJ     Note: Gram Stain Report Called to,Read Back By and Verified With: OLOFINTUYI AT 0408 02/22/13 BY SNOLO   Report Status 02/26/2013 FINAL  Final   Organism ID, Bacteria ACINETOBACTER CALCOACETICUS/BAUMANNII COMPLEX   Final  CULTURE, BLOOD (ROUTINE X 2)     Status: None   Collection Time    02/21/13 10:15 AM      Result Value Range Status   Specimen Description BLOOD HAND RIGHT   Final   Special Requests BOTTLES DRAWN AEROBIC ONLY 8CC   Final   Culture  Setup Time 02/21/2013 14:03   Final   Culture     Final   Value: ACINETOBACTER CALCOACETICUS/BAUMANNII COMPLEX     Note: SUSCEPTIBILITIES PERFORMED ON PREVIOUS CULTURE WITHIN THE LAST 5 DAYS.     Note: CRITICAL RESULT CALLED TO, READ BACK BY AND VERIFIED WITH: OLOFINTUYI AT 0408 02/22/13 BY SNOLO   Report Status 02/26/2013 FINAL   Final  URINE CULTURE     Status: None   Collection Time    02/21/13 11:15 AM      Result Value Range Status   Specimen Description URINE, RANDOM   Final   Special Requests  Normal   Final   Culture  Setup Time 02/21/2013 14:17   Final   Colony Count >=100,000 COLONIES/ML   Final   Culture YEAST   Final   Report Status 02/22/2013 FINAL   Final  URINE CULTURE     Status: None   Collection Time    02/21/13  4:06 PM      Result Value Range Status   Specimen Description URINE, RANDOM   Final   Special Requests Normal   Final   Culture  Setup Time 02/22/2013 04:18   Final   Colony Count >=100,000 COLONIES/ML   Final   Culture YEAST   Final   Report Status 02/23/2013 FINAL   Final  CULTURE, BAL-QUANTITATIVE     Status: None   Collection Time    02/23/13  2:05 PM      Result Value Range Status   Specimen Description BRONCHIAL ALVEOLAR LAVAGE   Final   Special Requests NONE   Final   Gram Stain     Final   Value: MODERATE WBC PRESENT, PREDOMINANTLY PMN     NO SQUAMOUS EPITHELIAL CELLS SEEN     MODERATE GRAM NEGATIVE RODS   Colony Count >=100,000 COLONIES/ML   Final   Culture     Final   Value: ACINETOBACTER CALCOACETICUS/BAUMANNII COMPLEX     Note: SUSCEPTIBILITIES PERFORMED ON PREVIOUS CULTURE WITHIN THE LAST 5 DAYS.   Report Status 02/25/2013 FINAL   Final    Studies/Results: US Renal  02/26/2013  *RADIOLOGY REPORT*  Clinical Data: Elevated creatinine.  RENAL/URINARY TRACT ULTRASOUND COMPLETE  Comparison:  None.  Findings:  Right Kidney:  11.3 cm.  8 mm x 6 mm x 7 mm cyst is present in the upper polar region of the right kidney. Normal echotexture otherwise.  No hydronephrosis.  Left Kidney:  11.7 cm. Normal echotexture.  Normal central sinus echo complex.  No calculi or hydronephrosis.  Bladder:  Decompressed with Foley catheter.  IMPRESSION: Normal appearance of the kidneys aside from a sub centimeter right upper pole renal cyst.   Original Report Authenticated By: Andreas Newport, M.D.    Dg Chest Portable 1 View  02/27/2013  *RADIOLOGY REPORT*  Clinical Data: Respiratory failure  PORTABLE CHEST - 1 VIEW  Comparison: 02/26/2013  Findings: Tracheostomy  tube tip in place projecting slightly to the right.  Right central line tip mid superior vena cava level.  Elevated left hemidiaphragm.  Heart size difficult to adequately assessed.  Central pulmonary vascular prominence without frank pulmonary edema.  Right mid to lower lung subsegmental atelectasis.  Slight improved aeration left lung base.  IMPRESSION: Slight improved aeration left lung base.  Persistent elevated left hemidiaphragm limits evaluation of the left lower lobe.   Original Report Authenticated By: Lacy Duverney, M.D.    Dg Chest Port 1 View  02/26/2013  *RADIOLOGY REPORT*  Clinical Data: PICC placement.  PORTABLE CHEST - 1 VIEW  Comparison: 02/23/2013.  Findings: New right upper extremity PICC is present with the tip in the mid-to-lower SVC.  The tip of the PICC is one vertebral body inferior to the carina.  Tracheostomy is present.  Retrocardiac density is present, probably representing atelectasis and small effusion.  Basilar atelectasis.  The airspace disease seen on prior exam has nearly resolved.  IMPRESSION:  1.  Right upper extremity PICC with the tip in the mid-to-lower SVC. 2.  Persistent dense retrocardiac opacification, probably representing a combination of atelectasis, effusion and airspace disease. 3.  Improvement in the bilateral basilar predominant airspace disease.   Original Report Authenticated By: Andreas Newport, M.D.       Assessment/Plan: Elizabeth Maddox is a 72 y.o. female 72 yo with COPD, chronic aspiration recent solicitation [facility] having been given multiple courses of antibiotics for healthcare associated pneumonia, ventilator dependent respiratory failure status post tracheostomy now found to have MDR Acinetobacter pneumonia and bacteremia   #1 MDR Acinetobacter PNA and bacteremia:  --continue colistin IV dosed per pharamacy  Plus rifampin 300mg  tid  --ask for tygacil sensis (unlikely to be active)  --PULL all central lines and cutlure  cath tips (catheter removed but new one placed in same site) --repeat Blood culturs x 2  --CCM helps greatly appreciated in clearing airway managing her resp status     LOS: 42 days   Acey Lav 02/27/2013, 3:55 PM

## 2013-02-28 ENCOUNTER — Other Ambulatory Visit (HOSPITAL_COMMUNITY): Payer: Self-pay

## 2013-02-28 LAB — URINE CULTURE: Colony Count: >=100000

## 2013-02-28 NOTE — Progress Notes (Signed)
[facility]'  Day # 3 colistin 150mg  IV q 8 hours Day #3 rifampin 300mg  q 8 hours  Subjective: C/o shaking and of mucous plugs   Antibiotics:  Anti-infectives   None      Medications: Scheduled Meds: Continuous Infusions: PRN Meds:.     Objective: Weight change:  No intake or output data in the 24 hours ending 02/28/13 1857 Blood pressure 161/72, pulse 98, resp. rate 22, SpO2 95.00%.    Physical Exam: General: Alert and awake, oriented x3, anxious  HEENT: anicteric sclera, pupils reactive to light and accommodation, trachea site less blood today CVS regular rate, normal r, no murmur rubs or gallops  Chest: Coarse breath sounds throughout  Abdomen: soft nontender, nondistended, normal bowel sounds,  Extremities: no clubbing or edema noted bilaterally  Skin: no rashes  Neuro: nonfocal, strength and sensation intact   Lab Results:  Recent Labs  02/27/13 0600  WBC 14.3*  HGB 9.6*  HCT 27.0*  PLT 266    BMET  Recent Labs  02/26/13 0645 02/27/13 0600  NA 127* 133*  K 5.7* 3.6  CL 91* 95*  CO2 25 29  GLUCOSE 117* 134*  BUN 56* 49*  CREATININE 1.67* 1.25*  CALCIUM 10.1 9.2    Micro Results: Recent Results (from the past 240 hour(s))  CLOSTRIDIUM DIFFICILE BY PCR     Status: None   Collection Time    02/21/13  9:55 AM      Result Value Range Status   C difficile by pcr NEGATIVE  NEGATIVE Final  CULTURE, RESPIRATORY (NON-EXPECTORATED)     Status: None   Collection Time    02/21/13  9:58 AM      Result Value Range Status   Specimen Description TRACHEAL ASPIRATE   Final   Special Requests NONE   Final   Gram Stain     Final   Value: FEW WBC PRESENT, PREDOMINANTLY PMN     NO SQUAMOUS EPITHELIAL CELLS SEEN     ABUNDANT GRAM NEGATIVE RODS   Culture     Final   Value: ABUNDANT ACINETOBACTER CALCOACETICUS/BAUMANNII COMPLEX     Note: COLISTIN  0.25ug/mL  SENSITIVE ETEST results for this drug are "FOR INVESTIGATIONAL  USE ONLY" and should NOT be used for clinical purposes. TIGECYCLINE MIC IS >=4 RESISTANT     Note: MULTI DRUG RESISTANT ORGANISM CRITICAL RESULT CALLED TO, READ BACK BY AND VERIFIED WITH: MONTAGUE RN 1010AM 02/25/13 GUSTK   Report Status PENDING   Incomplete   Organism ID, Bacteria ACINETOBACTER CALCOACETICUS/BAUMANNII COMPLEX   Final  CULTURE, BLOOD (ROUTINE X 2)     Status: None   Collection Time    02/21/13 10:10 AM      Result Value Range Status   Specimen Description BLOOD FOREARM RIGHT   Final   Special Requests BOTTLES DRAWN AEROBIC ONLY [facility]   Final   Culture  Setup Time 02/21/2013 14:03   Final   Culture     Final   Value: ACINETOBACTER CALCOACETICUS/BAUMANNII COMPLEX     Note: COLISTIN = 0.25ug/ml (SENSITIVE) ETEST results for this drug are "FOR INVESTIGATIONAL USE ONLY" and should NOT be used for clinical purposes. MULTI DRUG RESISTANT CRITICAL RESULT CALLED TO, READ BACK BY AND VERIFIED WITH: JOSEPH FURGUAY 02/26/13      1005 BY SMITHERSJ     Note: Gram Stain Report Called to,Read Back By and Verified With: OLOFINTUYI AT 0408 02/22/13 BY SNOLO   Report Status PENDING  Incomplete   Organism ID, Bacteria ACINETOBACTER CALCOACETICUS/BAUMANNII COMPLEX   Final  CULTURE, BLOOD (ROUTINE X 2)     Status: None   Collection Time    02/21/13 10:15 AM      Result Value Range Status   Specimen Description BLOOD HAND RIGHT   Final   Special Requests BOTTLES DRAWN AEROBIC ONLY 8CC   Final   Culture  Setup Time 02/21/2013 14:03   Final   Culture     Final   Value: ACINETOBACTER CALCOACETICUS/BAUMANNII COMPLEX     Note: SUSCEPTIBILITIES PERFORMED ON PREVIOUS CULTURE WITHIN THE LAST 5 DAYS.     Note: CRITICAL RESULT CALLED TO, READ BACK BY AND VERIFIED WITH: OLOFINTUYI AT 0408 02/22/13 BY SNOLO   Report Status 02/26/2013 FINAL   Final  URINE CULTURE     Status: None   Collection Time    02/21/13 11:15 AM      Result Value Range Status   Specimen Description URINE, RANDOM   Final   Special  Requests Normal   Final   Culture  Setup Time 02/21/2013 14:17   Final   Colony Count >=100,000 COLONIES/ML   Final   Culture YEAST   Final   Report Status 02/22/2013 FINAL   Final  URINE CULTURE     Status: None   Collection Time    02/21/13  4:06 PM      Result Value Range Status   Specimen Description URINE, RANDOM   Final   Special Requests Normal   Final   Culture  Setup Time 02/22/2013 04:18   Final   Colony Count >=100,000 COLONIES/ML   Final   Culture YEAST   Final   Report Status 02/23/2013 FINAL   Final  CULTURE, BAL-QUANTITATIVE     Status: None   Collection Time    02/23/13  2:05 PM      Result Value Range Status   Specimen Description BRONCHIAL ALVEOLAR LAVAGE   Final   Special Requests NONE   Final   Gram Stain     Final   Value: MODERATE WBC PRESENT, PREDOMINANTLY PMN     NO SQUAMOUS EPITHELIAL CELLS SEEN     MODERATE GRAM NEGATIVE RODS   Colony Count >=100,000 COLONIES/ML   Final   Culture     Final   Value: ACINETOBACTER CALCOACETICUS/BAUMANNII COMPLEX     Note: SUSCEPTIBILITIES PERFORMED ON PREVIOUS CULTURE WITHIN THE LAST 5 DAYS.   Report Status 02/25/2013 FINAL   Final  CATH TIP CULTURE     Status: None   Collection Time    02/26/13 10:32 PM      Result Value Range Status   Specimen Description CATH TIP PICC LINE   Final   Special Requests NONE   Final   Culture NO GROWTH 1 DAY   Final   Report Status PENDING   Incomplete  URINE CULTURE     Status: None   Collection Time    02/26/13 11:30 PM      Result Value Range Status   Specimen Description URINE, RANDOM   Final   Special Requests ADDED 0041 02/27/13   Final   Culture  Setup Time 02/27/2013 01:37   Final   Colony Count >=100,000 COLONIES/ML   Final   Culture YEAST   Final   Report Status 02/28/2013 FINAL   Final    Studies/Results: Dg Chest Port 1 View  02/28/2013  *RADIOLOGY REPORT*  Clinical Data: Respiratory failure.  PORTABLE CHEST - 1  VIEW  Comparison: 02/27/2013  Findings: Tracheostomy  and right PICC line positioning appears stable.  Slight increase in right lower lung atelectasis/infiltrate.  Left lower lobe atelectasis/consolidation is stable.  No pulmonary edema or enlarging pleural effusions are identified.  Heart size is stable.  IMPRESSION: Increase in right lower lung atelectasis/infiltrate.  Stable left lower lobe consolidation.   Original Report Authenticated By: Irish Lack, M.D.    Dg Chest Portable 1 View  02/27/2013  *RADIOLOGY REPORT*  Clinical Data: Respiratory failure  PORTABLE CHEST - 1 VIEW  Comparison: 02/26/2013  Findings: Tracheostomy tube tip in place projecting slightly to the right.  Right central line tip mid superior vena cava level.  Elevated left hemidiaphragm.  Heart size difficult to adequately assessed.  Central pulmonary vascular prominence without frank pulmonary edema.  Right mid to lower lung subsegmental atelectasis.  Slight improved aeration left lung base.  IMPRESSION: Slight improved aeration left lung base.  Persistent elevated left hemidiaphragm limits evaluation of the left lower lobe.   Original Report Authenticated By: Lacy Duverney, M.D.    Dg Chest Port 1 View  02/26/2013  *RADIOLOGY REPORT*  Clinical Data: PICC placement.  PORTABLE CHEST - 1 VIEW  Comparison: 02/23/2013.  Findings: New right upper extremity PICC is present with the tip in the mid-to-lower SVC.  The tip of the PICC is one vertebral body inferior to the carina.  Tracheostomy is present.  Retrocardiac density is present, probably representing atelectasis and small effusion.  Basilar atelectasis.  The airspace disease seen on prior exam has nearly resolved.  IMPRESSION:  1.  Right upper extremity PICC with the tip in the mid-to-lower SVC. 2.  Persistent dense retrocardiac opacification, probably representing a combination of atelectasis, effusion and airspace disease. 3.  Improvement in the bilateral basilar predominant airspace disease.   Original Report Authenticated By:  Andreas Newport, M.D.       Assessment/Plan: Elizabeth Maddox is a 72 y.o. 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 Candida on urine culture.  #1 MDR Acinetobacter PNA and bacteremia:   --continue colistin IV dosed per pharamacy  Plus rifampin 300mg  tid  --IF POSSIBLE WOULD DC THE NEWLY PLACED PICC LINE IF A PERIPHERAL IV IS VIABLE option and give her a holiday from central lines while we try to ensure clearance of her bacteremic   --I called [place]lia with [facility] and Tygacil, minocycline and rifampin sensis being added --repeat blood cultures taken  #2 Candiduria: ---DC or at least exhange old foley for new one to clear this  Dr. Ninetta Lights is taking over the service tomorrow.    LOS: 43 days   Acey Lav 02/28/2013, 6:57 PM

## 2013-03-01 ENCOUNTER — Other Ambulatory Visit (HOSPITAL_COMMUNITY): Payer: Self-pay

## 2013-03-01 DIAGNOSIS — B3749 Other urogenital candidiasis: Secondary | ICD-10-CM

## 2013-03-01 LAB — COMPREHENSIVE METABOLIC PANEL
Alkaline Phosphatase: 101 U/L (ref 39–117)
BUN: 25 mg/dL — ABNORMAL HIGH (ref 6–23)
Calcium: 9.5 mg/dL (ref 8.4–10.5)
Creatinine, Ser: 0.84 mg/dL (ref 0.50–1.10)
GFR calc Af Amer: 80 mL/min — ABNORMAL LOW (ref >90)
Glucose, Bld: 101 mg/dL — ABNORMAL HIGH (ref 70–99)
Potassium: 3.7 mEq/L (ref 3.5–5.1)
Total Protein: 5.9 g/dL — ABNORMAL LOW (ref 6.0–8.3)

## 2013-03-01 LAB — CATH TIP CULTURE: Culture: NO GROWTH

## 2013-03-01 LAB — CBC
HCT: 23.3 % — ABNORMAL LOW (ref 36.0–46.0)
Hemoglobin: 8.3 g/dL — ABNORMAL LOW (ref 12.0–15.0)
MCH: 30 pg (ref 26.0–34.0)
MCHC: 35.6 g/dL (ref 30.0–36.0)
MCV: 84.1 fL (ref 78.0–100.0)
RDW: 15.7 % — ABNORMAL HIGH (ref 11.5–15.5)

## 2013-03-01 NOTE — Progress Notes (Signed)
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  SIGNIFICANT EVENTS / STUDIES:  4/10 pulmonary distress and OTT placed 4/23 - intubated for decreased mental status after episode of drinking NGT flush and choking, ABG relatively good, concern for aspiration & potential c-diff with diarrhea (although PCR negative on 4/10) 4-28 bleeding from trach site LINES / TUBES: 4/10 ott>>4/14 4/23 OTT >> 4-26 3/28 peg>> Picc ??>> 4-26 trach>> CULTURES: resp 3/25>>  e coli  3/31 BCx2>>>neg 4/10 C-diff >>>neg 4/10 Resp culture>>>neg  4/23 BCx2>>> GNR's >> acinetobacter  4/23 C-Diff>>> negative 4/23 UC>>> yeast 4/23 Resp>>> acinetobacter   ANTIBIOTICS: Meropenem 4/23 >>  vanco 4/23 >> 4/25 vanco per tube 4/23 (empiric) >> 4/24 cipro 4/24 (double cover GNR) >> 4/26   SUBJECTIVE:  No more bleeding trach  VITAL SIGNS: Vital signs reviewed. Abnormal values will appear under impression plan section.    PHYSICAL EXAMINATION: General:  Frail elderly female, awake on MV Neuro:  awake, interacts, follows commands HEENT: no pallor, icterus, JVD, trach in place with copious bleeding from trach site Neck:  No LAN Cardiovascular:  HSR RRR Lungs:   Decreased in bases Abdomen:  Peg in place, c/d/i Musculoskeletal:  Wasted musculature Skin:  warm   Recent Labs Lab 02/26/13 0645 02/27/13 0600 03/01/13 0645  NA 127* 133* 132*  K 5.7* 3.6 3.7  CL 91* 95* 96  CO2 25 29 28   BUN 56* 49* 25*  CREATININE 1.67* 1.25* 0.84  GLUCOSE 117* 134* 101*    Recent Labs Lab 02/23/13 0543 02/27/13 0600 03/01/13 0645  HGB 9.5* 9.6* 8.3*  HCT 27.3* 27.0* 23.3*  WBC 22.2* 14.3* 9.9  PLT 259 266  242   Dg Chest Port 1 View  03/01/2013  *RADIOLOGY REPORT*  Clinical Data: Respiratory failure.  PORTABLE CHEST - 1 VIEW  Comparison: Chest x-ray 02/28/2013.  Findings: A tracheostomy tube is in place with tip 7.5 cm above the carina. There is a right upper extremity PICC with tip terminating in the mid superior vena cava. Lung volumes are low, but the aeration has improved slightly with decreasing bibasilar opacities, which may reflect resolving areas of atelectasis and/or consolidations.  A small left pleural effusion is decreasing.  No evidence of pulmonary edema.  Heart size is within normal limits. Upper mediastinal contours are unremarkable.  Atherosclerosis in the thoracic aorta.  IMPRESSION: 1.  Support apparatus, as above. 2.  Improving bibasilar aeration compatible with resolving areas of atelectasis and/or consolidation, and decreasing small left pleural effusion. 3.  Atherosclerosis.   Original Report Authenticated By: Trudie Reed, M.D.    Dg Chest Port 1 View  02/28/2013  *RADIOLOGY REPORT*  Clinical Data: Respiratory failure.  PORTABLE CHEST - 1 VIEW  Comparison: 02/27/2013  Findings: Tracheostomy and right PICC line positioning appears stable.  Slight increase in right lower lung atelectasis/infiltrate.  Left lower lobe atelectasis/consolidation is stable.  No pulmonary edema or enlarging pleural effusions are identified.  Heart size is stable.  IMPRESSION: Increase in right lower lung atelectasis/infiltrate.  Stable left lower lobe consolidation.   Original Report Authenticated By: Irish Lack, M.D.    ABG    Component Value Date/Time   PHART 7.426 02/26/2013 1830  PCO2ART 39.3 02/26/2013 1830   PO2ART 74.1* 02/26/2013 1830   HCO3 25.4* 02/26/2013 1830   TCO2 26.6 02/26/2013 1830   O2SAT 96.2 02/26/2013 1830     ASSESSMENT / PLAN: Acute on Chronic resp failure -  in 72 yo with chronic aspiration, volume overload, multiple HCAP's. She has severe underlying COPD which is O2  dependent, Chronic aspiration with PEG placed 3/28 but suspected continued PO intake.  She has had lingering LLL atx vs infiltrate.  Concern for aspiration event 4/22 after she reportedly drank PEG flush. B infiltrates > reintubated. S/p trach on 4/25 4-28 has continued to bleed via trach. 4/28- surgiseal placed and blood controlled Plan: -will pull some of loose surgiseal off Will re eval trach again in am  To goal 8 hrs PS today -renal fxn improving -keep even balance -pcxr reviewed -abg adaquate in setting prior hyper K   Septic Shock - GNR bacteremia, ACINETOBACTER CALCOACETICUS/BAUMANNII COMPLEX  PAN resitant  aspiration event, compounded by sedating medications; resolved 4/24 norepi weaned to off Plan: -rifampin per ID  Severe Dysphagia Plan: -strict NPO -PEG feedings for nutrition  Pan res bacteremia Ensure no central access ID consult appreciated  I have fully examined this patient and agree with above findings.    And edited in full  Mcarthur Rossetti. Tyson Alias, MD, FACP Pgr: [phone] [facility]

## 2013-03-01 NOTE — Progress Notes (Signed)
INFECTIOUS DISEASE PROGRESS NOTE  ID: Elizabeth Maddox is a 72 y.o. female with  Active Problems:   Acute respiratory failure   Acute pulmonary edema   Hypoxemia   COPD (chronic obstructive pulmonary disease)   Dysphagia   Septic shock(785.52)   Sepsis due to Acinetobacter  Subjective: Agitated Per nursing, pt feels like she is withdrawing from her medication change.   Abtx:  Anti-infectives   None      Medications: see MAR  Objective: Vital signs in last 24 hours: 99.4    71     18/100%    149/74   General appearance: alert, cooperative, moderate distress and pale Throat: bruising around trach site Resp: rhonchi bilaterally Cardio: regular rate and rhythm GI: normal findings: bowel sounds normal and soft, non-tender Extremities: brusing  Lab Results  Recent Labs  02/27/13 0600 03/01/13 0645  WBC 14.3* 9.9  HGB 9.6* 8.3*  HCT 27.0* 23.3*  NA 133* 132*  K 3.6 3.7  CL 95* 96  CO2 29 28  BUN 49* 25*  CREATININE 1.25* 0.84   Liver Panel  Recent Labs  03/01/13 0645  PROT 5.9*  ALBUMIN 2.5*  AST 21  ALT 14  ALKPHOS 101  BILITOT 1.1   Sedimentation Rate No results found for this basename: ESRSEDRATE,  in the last 72 hours C-Reactive Protein No results found for this basename: CRP,  in the last 72 hours  Microbiology: Recent Results (from the past 240 hour(s))  CLOSTRIDIUM DIFFICILE BY PCR     Status: None   Collection Time    02/21/13  9:55 AM      Result Value Range Status   C difficile by pcr NEGATIVE  NEGATIVE Final  CULTURE, RESPIRATORY (NON-EXPECTORATED)     Status: None   Collection Time    02/21/13  9:58 AM      Result Value Range Status   Specimen Description TRACHEAL ASPIRATE   Final   Special Requests NONE   Final   Gram Stain     Final   Value: FEW WBC PRESENT, PREDOMINANTLY PMN     NO SQUAMOUS EPITHELIAL CELLS SEEN     ABUNDANT GRAM NEGATIVE RODS   Culture     Final   Value: ABUNDANT ACINETOBACTER CALCOACETICUS/BAUMANNII  COMPLEX     Note: COLISTIN  0.25ug/mL  SENSITIVE ETEST results for this drug are "FOR INVESTIGATIONAL USE ONLY" and should NOT be used for clinical purposes. TIGECYCLINE MIC IS >=4 RESISTANT     Note: MULTI DRUG RESISTANT ORGANISM CRITICAL RESULT CALLED TO, READ BACK BY AND VERIFIED WITH: MONTAGUE RN 1010AM 02/25/13 GUSTK   Report Status PENDING   Incomplete   Organism ID, Bacteria ACINETOBACTER CALCOACETICUS/BAUMANNII COMPLEX   Final  CULTURE, BLOOD (ROUTINE X 2)     Status: None   Collection Time    02/21/13 10:10 AM      Result Value Range Status   Specimen Description BLOOD FOREARM RIGHT   Final   Special Requests BOTTLES DRAWN AEROBIC ONLY [facility]   Final   Culture  Setup Time 02/21/2013 14:03   Final   Culture     Final   Value: ACINETOBACTER CALCOACETICUS/BAUMANNII COMPLEX     Note: COLISTIN = 0.25ug/ml (SENSITIVE) ETEST results for this drug are "FOR INVESTIGATIONAL USE ONLY" and should NOT be used for clinical purposes. MULTI DRUG RESISTANT CRITICAL RESULT CALLED TO, READ BACK BY AND VERIFIED WITH: JOSEPH FURGUAY 02/26/13      1005 BY SMITHERSJ  Note: Gram Stain Report Called to,Read Back By and Verified With: OLOFINTUYI AT 0408 02/22/13 BY SNOLO   Report Status PENDING   Incomplete   Organism ID, Bacteria ACINETOBACTER CALCOACETICUS/BAUMANNII COMPLEX   Final  CULTURE, BLOOD (ROUTINE X 2)     Status: None   Collection Time    02/21/13 10:15 AM      Result Value Range Status   Specimen Description BLOOD HAND RIGHT   Final   Special Requests BOTTLES DRAWN AEROBIC ONLY 8[facility]   Final   Culture  Setup Time 02/21/2013 14:03   Final   Culture     Final   Value: ACINETOBACTER CALCOACETICUS/BAUMANNII COMPLEX     Note: SUSCEPTIBILITIES PERFORMED ON PREVIOUS CULTURE WITHIN THE LAST 5 DAYS.     Note: CRITICAL RESULT CALLED TO, READ BACK BY AND VERIFIED WITH: OLOFINTUYI AT 0408 02/22/13 BY SNOLO   Report Status 02/26/2013 FINAL   Final  URINE CULTURE     Status: None   Collection Time     02/21/13 11:15 AM      Result Value Range Status   Specimen Description URINE, RANDOM   Final   Special Requests Normal   Final   Culture  Setup Time 02/21/2013 14:17   Final   Colony Count >=100,000 COLONIES/ML   Final   Culture YEAST   Final   Report Status 02/22/2013 FINAL   Final  URINE CULTURE     Status: None   Collection Time    02/21/13  4:06 PM      Result Value Range Status   Specimen Description URINE, RANDOM   Final   Special Requests Normal   Final   Culture  Setup Time 02/22/2013 04:18   Final   Colony Count >=100,000 COLONIES/ML   Final   Culture YEAST   Final   Report Status 02/23/2013 FINAL   Final  CULTURE, BAL-QUANTITATIVE     Status: None   Collection Time    02/23/13  2:05 PM      Result Value Range Status   Specimen Description BRONCHIAL ALVEOLAR LAVAGE   Final   Special Requests NONE   Final   Gram Stain     Final   Value: MODERATE WBC PRESENT, PREDOMINANTLY PMN     NO SQUAMOUS EPITHELIAL CELLS SEEN     MODERATE GRAM NEGATIVE RODS   Colony Count >=100,000 COLONIES/ML   Final   Culture     Final   Value: ACINETOBACTER CALCOACETICUS/BAUMANNII COMPLEX     Note: SUSCEPTIBILITIES PERFORMED ON PREVIOUS CULTURE WITHIN THE LAST 5 DAYS.   Report Status 02/25/2013 FINAL   Final  CATH TIP CULTURE     Status: None   Collection Time    02/26/13 10:32 PM      Result Value Range Status   Specimen Description CATH TIP PICC LINE   Final   Special Requests NONE   Final   Culture NO GROWTH 2 DAYS   Final   Report Status 03/01/2013 FINAL   Final  URINE CULTURE     Status: None   Collection Time    02/26/13 11:30 PM      Result Value Range Status   Specimen Description URINE, RANDOM   Final   Special Requests ADDED 0041 02/27/13   Final   Culture  Setup Time 02/27/2013 01:37   Final   Colony Count >=100,000 COLONIES/ML   Final   Culture YEAST   Final   Report Status 02/28/2013 FINAL  Final  CULTURE, BLOOD (ROUTINE X 2)     Status: None   Collection Time     02/27/13  4:47 PM      Result Value Range Status   Specimen Description BLOOD RIGHT HAND   Final   Special Requests BOTTLES DRAWN AEROBIC ONLY 3[facility]   Final   Culture  Setup Time 02/28/2013 01:12   Final   Culture     Final   Value:        BLOOD CULTURE RECEIVED NO GROWTH TO DATE CULTURE WILL BE HELD FOR 5 DAYS BEFORE ISSUING A FINAL NEGATIVE REPORT   Report Status PENDING   Incomplete  CULTURE, BLOOD (ROUTINE X 2)     Status: None   Collection Time    02/27/13  4:48 PM      Result Value Range Status   Specimen Description BLOOD LEFT HAND   Final   Special Requests BOTTLES DRAWN AEROBIC ONLY [facility]   Final   Culture  Setup Time 02/28/2013 01:11   Final   Culture     Final   Value:        BLOOD CULTURE RECEIVED NO GROWTH TO DATE CULTURE WILL BE HELD FOR 5 DAYS BEFORE ISSUING A FINAL NEGATIVE REPORT   Report Status PENDING   Incomplete    Studies/Results: Dg Chest Port 1 View  03/01/2013  *RADIOLOGY REPORT*  Clinical Data: Respiratory failure.  PORTABLE CHEST - 1 VIEW  Comparison: Chest x-ray 02/28/2013.  Findings: A tracheostomy tube is in place with tip 7.5 cm above the carina. There is a right upper extremity PICC with tip terminating in the mid superior vena cava. Lung volumes are low, but the aeration has improved slightly with decreasing bibasilar opacities, which may reflect resolving areas of atelectasis and/or consolidations.  A small left pleural effusion is decreasing.  No evidence of pulmonary edema.  Heart size is within normal limits. Upper mediastinal contours are unremarkable.  Atherosclerosis in the thoracic aorta.  IMPRESSION: 1.  Support apparatus, as above. 2.  Improving bibasilar aeration compatible with resolving areas of atelectasis and/or consolidation, and decreasing small left pleural effusion. 3.  Atherosclerosis.   Original Report Authenticated By: Trudie Reed, M.D.    Dg Chest Port 1 View  02/28/2013  *RADIOLOGY REPORT*  Clinical Data: Respiratory failure.  PORTABLE  CHEST - 1 VIEW  Comparison: 02/27/2013  Findings: Tracheostomy and right PICC line positioning appears stable.  Slight increase in right lower lung atelectasis/infiltrate.  Left lower lobe atelectasis/consolidation is stable.  No pulmonary edema or enlarging pleural effusions are identified.  Heart size is stable.  IMPRESSION: Increase in right lower lung atelectasis/infiltrate.  Stable left lower lobe consolidation.   Original Report Authenticated By: Irish Lack, M.D.      Assessment/Plan: COPD, VDRF Acinetobacter bacteremia (4-23), pneumonia  Total days of antibiotics: 8    Colistin/Rifampin   Day 2  No change in anbx Repeat BCx 4-29 cooking, ngtd.  Watch Cr closely will on colistin (planned 2 week course).          Johny Sax Infectious Diseases [phone] 03/01/2013, 3:17 PM   LOS: 44 days

## 2013-03-02 LAB — CULTURE, BLOOD (ROUTINE X 2)

## 2013-03-02 LAB — CBC
HCT: 22.9 % — ABNORMAL LOW (ref 36.0–46.0)
RBC: 2.74 MIL/uL — ABNORMAL LOW (ref 3.87–5.11)
RDW: 15.7 % — ABNORMAL HIGH (ref 11.5–15.5)
WBC: 8.8 10*3/uL (ref 4.0–10.5)

## 2013-03-02 LAB — CULTURE, RESPIRATORY W GRAM STAIN

## 2013-03-02 LAB — BASIC METABOLIC PANEL
BUN: 25 mg/dL — ABNORMAL HIGH (ref 6–23)
CO2: 28 mEq/L (ref 19–32)
Chloride: 91 mEq/L — ABNORMAL LOW (ref 96–112)
GFR calc Af Amer: 68 mL/min — ABNORMAL LOW (ref >90)
Potassium: 4.2 mEq/L (ref 3.5–5.1)

## 2013-03-02 NOTE — Progress Notes (Signed)
INFECTIOUS DISEASE PROGRESS NOTE  ID: Elizabeth Maddox is a 72 y.o. female with  Active Problems:   Acute respiratory failure   Acute pulmonary edema   Hypoxemia   COPD (chronic obstructive pulmonary disease)   Dysphagia   Septic shock(785.52)   Sepsis due to Acinetobacter  Subjective: Much less agitated than yesterday.   Abtx:  Anti-infectives   None      Medications: please see MAR  Objective: Vital signs in last 24 hours:  97.8        76    157/69       18/99%  General appearance: alert, cooperative and no distress Resp: clear to auscultation bilaterally Cardio: regular rate and rhythm GI: normal findings: bowel sounds normal and soft, non-tender  Lab Results  Recent Labs  03/01/13 0645 03/02/13 0630  WBC 9.9 8.8  HGB 8.3* 8.3*  HCT 23.3* 22.9*  NA 132* 128*  K 3.7 4.2  CL 96 91*  CO2 28 28  BUN 25* 25*  CREATININE 0.84 0.96   Liver Panel  Recent Labs  03/01/13 0645  PROT 5.9*  ALBUMIN 2.5*  AST 21  ALT 14  ALKPHOS 101  BILITOT 1.1   Sedimentation Rate No results found for this basename: ESRSEDRATE,  in the last 72 hours C-Reactive Protein No results found for this basename: CRP,  in the last 72 hours  Microbiology: Recent Results (from the past 240 hour(s))  CLOSTRIDIUM DIFFICILE BY PCR     Status: None   Collection Time    02/21/13  9:55 AM      Result Value Range Status   C difficile by pcr NEGATIVE  NEGATIVE Final  CULTURE, RESPIRATORY (NON-EXPECTORATED)     Status: None   Collection Time    02/21/13  9:58 AM      Result Value Range Status   Specimen Description TRACHEAL ASPIRATE   Final   Special Requests NONE   Final   Gram Stain     Final   Value: FEW WBC PRESENT, PREDOMINANTLY PMN     NO SQUAMOUS EPITHELIAL CELLS SEEN     ABUNDANT GRAM NEGATIVE RODS   Culture     Final   Value: ABUNDANT ACINETOBACTER CALCOACETICUS/BAUMANNII COMPLEX     Note: COLISTIN  0.25ug/mL  SENSITIVE ETEST results for this drug are "FOR  INVESTIGATIONAL USE ONLY" and should NOT be used for clinical purposes. TIGECYCLINE MIC IS >=4 RESISTANT     Note: MULTI DRUG RESISTANT ORGANISM CRITICAL RESULT CALLED TO, READ BACK BY AND VERIFIED WITH: MONTAGUE RN [id number] 02/25/13 GUSTK   Report Status 03/02/2013 FINAL   Final   Organism ID, Bacteria ACINETOBACTER CALCOACETICUS/BAUMANNII COMPLEX   Final  CULTURE, BLOOD (ROUTINE X 2)     Status: None   Collection Time    02/21/13 10:10 AM      Result Value Range Status   Specimen Description BLOOD FOREARM RIGHT   Final   Special Requests BOTTLES DRAWN AEROBIC ONLY [facility]   Final   Culture  Setup Time 02/21/2013 14:03   Final   Culture     Final   Value: ACINETOBACTER CALCOACETICUS/BAUMANNII COMPLEX     4 Note: TIGECYCLINE = I COLISTIN = 0.25ug/ml (SENSITIVE) ETEST results for this drug are "FOR INVESTIGATIONAL USE ONLY" and should NOT be used for clinical purposes. MULTI DRUG RESISTANT CRITICAL RESULT CALLED TO, READ BACK BY AND VERIFIED WITH: JOSEPH      FURGUAY 02/26/13 1005 BY SMITHERSJ  Note: Gram Stain Report Called to,Read Back By and Verified With: OLOFINTUYI AT 0408 02/22/13 BY SNOLO   Report Status 03/02/2013 FINAL   Final   Organism ID, Bacteria ACINETOBACTER CALCOACETICUS/BAUMANNII COMPLEX   Final  CULTURE, BLOOD (ROUTINE X 2)     Status: None   Collection Time    02/21/13 10:15 AM      Result Value Range Status   Specimen Description BLOOD HAND RIGHT   Final   Special Requests BOTTLES DRAWN AEROBIC ONLY [facility]   Final   Culture  Setup Time 02/21/2013 14:03   Final   Culture     Final   Value: ACINETOBACTER CALCOACETICUS/BAUMANNII COMPLEX     Note: SUSCEPTIBILITIES PERFORMED ON PREVIOUS CULTURE WITHIN THE LAST 5 DAYS.     Note: CRITICAL RESULT CALLED TO, READ BACK BY AND VERIFIED WITH: OLOFINTUYI AT 0408 02/22/13 BY SNOLO   Report Status 02/26/2013 FINAL   Final  URINE CULTURE     Status: None   Collection Time    02/21/13 11:15 AM      Result Value Range Status   Specimen  Description URINE, RANDOM   Final   Special Requests Normal   Final   Culture  Setup Time 02/21/2013 14:17   Final   Colony Count >=100,000 COLONIES/ML   Final   Culture YEAST   Final   Report Status 02/22/2013 FINAL   Final  URINE CULTURE     Status: None   Collection Time    02/21/13  4:06 PM      Result Value Range Status   Specimen Description URINE, RANDOM   Final   Special Requests Normal   Final   Culture  Setup Time 02/22/2013 04:18   Final   Colony Count >=100,000 COLONIES/ML   Final   Culture YEAST   Final   Report Status 02/23/2013 FINAL   Final  CULTURE, BAL-QUANTITATIVE     Status: None   Collection Time    02/23/13  2:05 PM      Result Value Range Status   Specimen Description BRONCHIAL ALVEOLAR LAVAGE   Final   Special Requests NONE   Final   Gram Stain     Final   Value: MODERATE WBC PRESENT, PREDOMINANTLY PMN     NO SQUAMOUS EPITHELIAL CELLS SEEN     MODERATE GRAM NEGATIVE RODS   Colony Count >=100,000 COLONIES/ML   Final   Culture     Final   Value: ACINETOBACTER CALCOACETICUS/BAUMANNII COMPLEX     Note: SUSCEPTIBILITIES PERFORMED ON PREVIOUS CULTURE WITHIN THE LAST 5 DAYS.   Report Status 02/25/2013 FINAL   Final  CATH TIP CULTURE     Status: None   Collection Time    02/26/13 10:32 PM      Result Value Range Status   Specimen Description CATH TIP PICC LINE   Final   Special Requests NONE   Final   Culture NO GROWTH 2 DAYS   Final   Report Status 03/01/2013 FINAL   Final  URINE CULTURE     Status: None   Collection Time    02/26/13 11:30 PM      Result Value Range Status   Specimen Description URINE, RANDOM   Final   Special Requests ADDED 0041 02/27/13   Final   Culture  Setup Time 02/27/2013 01:37   Final   Colony Count >=100,000 COLONIES/ML   Final   Culture YEAST   Final   Report Status 02/28/2013 FINAL  Final  CULTURE, BLOOD (ROUTINE X 2)     Status: None   Collection Time    02/27/13  4:47 PM      Result Value Range Status   Specimen  Description BLOOD RIGHT HAND   Final   Special Requests BOTTLES DRAWN AEROBIC ONLY [facility]   Final   Culture  Setup Time 02/28/2013 01:12   Final   Culture     Final   Value:        BLOOD CULTURE RECEIVED NO GROWTH TO DATE CULTURE WILL BE HELD FOR 5 DAYS BEFORE ISSUING A FINAL NEGATIVE REPORT   Report Status PENDING   Incomplete  CULTURE, BLOOD (ROUTINE X 2)     Status: None   Collection Time    02/27/13  4:48 PM      Result Value Range Status   Specimen Description BLOOD LEFT HAND   Final   Special Requests BOTTLES DRAWN AEROBIC ONLY [facility]   Final   Culture  Setup Time 02/28/2013 01:11   Final   Culture     Final   Value:        BLOOD CULTURE RECEIVED NO GROWTH TO DATE CULTURE WILL BE HELD FOR 5 DAYS BEFORE ISSUING A FINAL NEGATIVE REPORT   Report Status PENDING   Incomplete    Studies/Results: Dg Chest Port 1 View  03/01/2013  *RADIOLOGY REPORT*  Clinical Data: Respiratory failure.  PORTABLE CHEST - 1 VIEW  Comparison: Chest x-ray 02/28/2013.  Findings: A tracheostomy tube is in place with tip 7.5 cm above the carina. There is a right upper extremity PICC with tip terminating in the mid superior vena cava. Lung volumes are low, but the aeration has improved slightly with decreasing bibasilar opacities, which may reflect resolving areas of atelectasis and/or consolidations.  A small left pleural effusion is decreasing.  No evidence of pulmonary edema.  Heart size is within normal limits. Upper mediastinal contours are unremarkable.  Atherosclerosis in the thoracic aorta.  IMPRESSION: 1.  Support apparatus, as above. 2.  Improving bibasilar aeration compatible with resolving areas of atelectasis and/or consolidation, and decreasing small left pleural effusion. 3.  Atherosclerosis.   Original Report Authenticated By: Trudie Reed, M.D.      Assessment/Plan: COPD, VDRF  Acinetobacter bacteremia (4-23), pneumonia  Total days of antibiotics: 9  Colistin/Rifampin Day 3 (of 14)  No change in anbx   Repeat BCx 4-29 cooking, ngtd.  Watch Cr closely on colistin (planned 2 week course). So far staying normal  Available if questions   Johny Sax Infectious Diseases [phone] 03/02/2013, 4:29 PM   LOS: 45 days

## 2013-03-02 NOTE — Progress Notes (Signed)
PULMONARY  / CRITICAL CARE MEDICINE  Name: Elizabeth Maddox MRN: [id number] DOB: [birth year]    ADMISSION DATE:  Jan 19, 2013 CONSULTATION DATE: 4-10  REFERRING MD :  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  SIGNIFICANT EVENTS / STUDIES:  4/10 pulmonary distress and OTT placed 4/23 - intubated for decreased mental status after episode of drinking NGT flush and choking, ABG relatively good, concern for aspiration & potential c-diff with diarrhea (although PCR negative on 4/10) 4-28 bleeding from trach site LINES / TUBES: 4/10 ott>>4/14 4/23 OTT >> 4-26 3/28 peg>> Picc ??>> 4-26 trach>> CULTURES: resp 3/25>>  e coli  3/31 BCx2>>>neg 4/10 C-diff >>>neg 4/10 Resp culture>>>neg  4/23 BCx2>>> GNR's >> acinetobacter  4/23 C-Diff>>> negative 4/23 UC>>> yeast 4/23 Resp>>> acinetobacter   ANTIBIOTICS: Meropenem 4/23 >>  vanco 4/23 >> 4/25 vanco per tube 4/23 (empiric) >> 4/24 cipro 4/24 (double cover GNR) >> 4/26   SUBJECTIVE: PS 12  VITAL SIGNS: Vital signs reviewed. Abnormal values will appear under impression plan section.    PHYSICAL EXAMINATION: General:  Frail elderly female, awake on MV Neuro:  awake, interacts, follows commands, less anxious HEENT: dry trach site Neck:  No LAN Cardiovascular:  HSR RRR Lungs:   Decreased in bases, cta anterior Abdomen:  Peg in place, c/d/i Musculoskeletal:  Wasted musculature Skin:  warm   Recent Labs Lab 02/27/13 0600 03/01/13 0645 03/02/13 0630  NA 133* 132* 128*  K 3.6 3.7 4.2  CL 95* 96 91*  CO2 29 28 28   BUN 49* 25* 25*  CREATININE 1.25* 0.84 0.96  GLUCOSE 134* 101* 108*    Recent Labs Lab 02/27/13 0600 03/01/13 0645 03/02/13 0630  HGB 9.6* 8.3* 8.3*  HCT 27.0* 23.3* 22.9*  WBC 14.3* 9.9 8.8  PLT 266 242 277   Dg Chest Port 1 View  03/01/2013  *RADIOLOGY  REPORT*  Clinical Data: Respiratory failure.  PORTABLE CHEST - 1 VIEW  Comparison: Chest x-ray 02/28/2013.  Findings: A tracheostomy tube is in place with tip 7.5 cm above the carina. There is a right upper extremity PICC with tip terminating in the mid superior vena cava. Lung volumes are low, but the aeration has improved slightly with decreasing bibasilar opacities, which may reflect resolving areas of atelectasis and/or consolidations.  A small left pleural effusion is decreasing.  No evidence of pulmonary edema.  Heart size is within normal limits. Upper mediastinal contours are unremarkable.  Atherosclerosis in the thoracic aorta.  IMPRESSION: 1.  Support apparatus, as above. 2.  Improving bibasilar aeration compatible with resolving areas of atelectasis and/or consolidation, and decreasing small left pleural effusion. 3.  Atherosclerosis.   Original Report Authenticated By: Trudie Reed, M.D.    ABG    Component Value Date/Time   PHART 7.426 02/26/2013 1830   PCO2ART 39.3 02/26/2013 1830   PO2ART 74.1* 02/26/2013 1830   HCO3 25.4* 02/26/2013 1830   TCO2 26.6 02/26/2013 1830   O2SAT 96.2 02/26/2013 1830     ASSESSMENT / PLAN: Acute on Chronic resp failure -  in 72 yo with chronic aspiration, volume overload, multiple HCAP's. She has severe underlying COPD which is O2 dependent, Chronic aspiration with PEG placed 3/28 but suspected continued PO intake.  She has had lingering LLL atx vs infiltrate.  Concern for aspiration event 4/22 after she reportedly drank PEG flush. B infiltrates > reintubated. S/p  trach on 4/25 4-28 has continued to bleed via trach. 4/28- surgiseal placed and blood controlled Plan: Allow surgiseal to absorb on own, will not remove Control anxiety Wean to PS 10-8 as goal today Treating sepsis  Septic - GNR bacteremia, ACINETOBACTER CALCOACETICUS/BAUMANNII COMPLEX  PAN resitant  aspiration event, compounded by sedating medications; resolved 4/24 norepi weaned to  off Plan: -rifampin / colistin per ID Daily crt on colistin  I have fully examined this patient and agree with above findings.    And edited in full  Mcarthur Rossetti. Tyson Alias, MD, FACP Pgr: [phone] [facility] Pulmonary & Critical Care

## 2013-03-03 LAB — BASIC METABOLIC PANEL
BUN: 25 mg/dL — ABNORMAL HIGH (ref 6–23)
Chloride: 92 mEq/L — ABNORMAL LOW (ref 96–112)
GFR calc Af Amer: 43 mL/min — ABNORMAL LOW (ref >90)
Potassium: 3.4 mEq/L — ABNORMAL LOW (ref 3.5–5.1)

## 2013-03-03 LAB — PRO B NATRIURETIC PEPTIDE: Pro B Natriuretic peptide (BNP): 772.3 pg/mL — ABNORMAL HIGH (ref 0–125)

## 2013-03-04 ENCOUNTER — Other Ambulatory Visit (HOSPITAL_COMMUNITY): Payer: Self-pay

## 2013-03-04 LAB — BASIC METABOLIC PANEL
BUN: 33 mg/dL — ABNORMAL HIGH (ref 6–23)
Chloride: 95 mEq/L — ABNORMAL LOW (ref 96–112)
Glucose, Bld: 90 mg/dL (ref 70–99)
Potassium: 3.7 mEq/L (ref 3.5–5.1)

## 2013-03-04 LAB — CBC
HCT: 35.4 % — ABNORMAL LOW (ref 36.0–46.0)
Hemoglobin: 12.8 g/dL (ref 12.0–15.0)
WBC: 7.9 10*3/uL (ref 4.0–10.5)

## 2013-03-05 DIAGNOSIS — A419 Sepsis, unspecified organism: Secondary | ICD-10-CM

## 2013-03-05 DIAGNOSIS — A4150 Gram-negative sepsis, unspecified: Secondary | ICD-10-CM

## 2013-03-05 DIAGNOSIS — J95851 Ventilator associated pneumonia: Secondary | ICD-10-CM

## 2013-03-05 LAB — BASIC METABOLIC PANEL
BUN: 46 mg/dL — ABNORMAL HIGH (ref 6–23)
CO2: 26 mEq/L (ref 19–32)
Chloride: 93 mEq/L — ABNORMAL LOW (ref 96–112)
Creatinine, Ser: 2.7 mg/dL — ABNORMAL HIGH (ref 0.50–1.10)

## 2013-03-05 NOTE — Progress Notes (Signed)
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  SIGNIFICANT EVENTS / STUDIES:  4/10 pulmonary distress and OTT placed 4/23 - intubated for decreased mental status after episode of drinking NGT flush and choking, ABG relatively good, concern for aspiration & potential c-diff with diarrhea (although PCR negative on 4/10) 4-28 bleeding from trach site  LINES / TUBES: 4/10 ott>>4/14 4/23 OTT >> 4-26 3/28 peg>> Picc ??>> 4-26 trach>>  CULTURES: resp 3/25>>  e coli  3/31 BCx2>>>neg 4/10 C-diff >>>neg 4/10 Resp culture>>>neg 4/23 BCx2>> acinetobacter  4/23 C-Diff>>> negative 4/23 UC>>> yeast 4/23 Resp>>> acinetobacter   ANTIBIOTICS: Meropenem 4/23 >>  vanco 4/23 >> 4/25 vanco per tube 4/23 (empiric) >> 4/24 cipro 4/24 (double cover GNR) >> 4/26   SUBJECTIVE: PS 12  VITAL SIGNS: Vital signs reviewed. Abnormal values will appear under impression plan section.    PHYSICAL EXAMINATION: General:  Frail elderly female, awake on MV Neuro:  awake, interacts, follows commands, less anxious HEENT: dry trach site Neck:  No LAN Cardiovascular:  HSR RRR Lungs:   Decreased in bases, cta anterior Abdomen:  Peg in place, c/d/i Musculoskeletal:  Wasted musculature Skin:  warm   Recent Labs Lab 03/03/13 0500 03/04/13 0500 03/05/13 0500  NA 132* 136 132*  K 3.4* 3.7 4.0  CL 92* 95* 93*  CO2 29 29 26   BUN 25* 33* 46*  CREATININE 1.39* 2.21* 2.70*  GLUCOSE 132* 90 151*    Recent Labs Lab 03/01/13 0645 03/02/13 0630 03/04/13 0500  HGB 8.3* 8.3* 12.8  HCT 23.3* 22.9* 35.4*  WBC 9.9 8.8 7.9  PLT 242 277 173   Dg Chest Portable 1 View  03/04/2013  *RADIOLOGY REPORT*   Clinical Data: Pneumonia  PORTABLE CHEST - 1 VIEW  Comparison: 03/01/2013  Findings: Cardiomediastinal silhouette is stable.  Tracheostomy tube is unchanged in position.  Stable right arm PICC line position.  No pulmonary edema.  Residual left basilar atelectasis or infiltrate.  No new infiltrate.  IMPRESSION: Stable support apparatus.  Residual left basilar atelectasis or infiltrate.  No pulmonary edema.   Original Report Authenticated By: Natasha Mead, M.D.    ABG    Component Value Date/Time   PHART 7.426 02/26/2013 1830   PCO2ART 39.3 02/26/2013 1830   PO2ART 74.1* 02/26/2013 1830   HCO3 25.4* 02/26/2013 1830   TCO2 26.6 02/26/2013 1830   O2SAT 96.2 02/26/2013 1830     ASSESSMENT / PLAN: Acute on Chronic resp failure -  in 71 yo with chronic aspiration, volume overload, multiple HCAP's. She has severe underlying COPD which is O2 dependent, Chronic aspiration with PEG placed 3/28 but suspected continued PO intake.  S/p trach on 4/25.  Looks a little better on PSV 5/05. Plan: Control anxiety Continue PSV   Septic - GNR bacteremia, ACINETOBACTER CALCOACETICUS/BAUMANNII COMPLEX, pan resistant.  Off pressors 4/24. Plan: -rifampin / colistin per ID  Acute renal failure  Plan: Per IM service  Nephro consult pending   Updated family at bedside.  CC time 35 minutes.  Coralyn Helling, MD [facility] Pulmonary/Critical Care 03/05/2013, 10:50 AM Pager:  [phone] After 3pm call: [phone]

## 2013-03-06 LAB — CULTURE, BLOOD (ROUTINE X 2)

## 2013-03-06 LAB — BASIC METABOLIC PANEL
CO2: 26 mEq/L (ref 19–32)
Calcium: 9.9 mg/dL (ref 8.4–10.5)
Creatinine, Ser: 3.19 mg/dL — ABNORMAL HIGH (ref 0.50–1.10)
Glucose, Bld: 129 mg/dL — ABNORMAL HIGH (ref 70–99)

## 2013-03-06 NOTE — Progress Notes (Addendum)
INFECTIOUS DISEASE PROGRESS NOTE  ID: Elizabeth Maddox is a 72 y.o. female with Active Problems:   Acute respiratory failure   Acute pulmonary edema   Hypoxemia   COPD (chronic obstructive pulmonary disease)   Dysphagia   Septic shock(785.52)   Sepsis due to Acinetobacter  Subjective: No response Per her son- no BM in 10 days  Abtx:  Anti-infectives   None      Medications: see MAR  Objective: Vital signs in last 24 hours:  99.2 87 117/73  22/99%   General appearance: fatigued and pale Resp: rhonchi bilaterally Cardio: tachycardic GI: abnormal findings:  distended and hypoactive bowel sounds Extremities: RUE PIC clean  Lab Results  Recent Labs  03/04/13 0500 03/05/13 0500 03/06/13 0500  WBC 7.9  --   --   HGB 12.8  --   --   HCT 35.4*  --   --   NA 136 132* 135  K 3.7 4.0 3.9  CL 95* 93* 98  CO2 29 26 26   BUN 33* 46* 49*  CREATININE 2.21* 2.70* 3.19*   Liver Panel No results found for this basename: PROT, ALBUMIN, AST, ALT, ALKPHOS, BILITOT, BILIDIR, IBILI,  in the last 72 hours Sedimentation Rate No results found for this basename: ESRSEDRATE,  in the last 72 hours C-Reactive Protein No results found for this basename: CRP,  in the last 72 hours  Microbiology: Recent Results (from the past 240 hour(s))  CATH TIP CULTURE     Status: None   Collection Time    02/26/13 10:32 PM      Result Value Range Status   Specimen Description CATH TIP PICC LINE   Final   Special Requests NONE   Final   Culture NO GROWTH 2 DAYS   Final   Report Status 03/01/2013 FINAL   Final  URINE CULTURE     Status: None   Collection Time    02/26/13 11:30 PM      Result Value Range Status   Specimen Description URINE, RANDOM   Final   Special Requests ADDED 0041 02/27/13   Final   Culture  Setup Time 02/27/2013 01:37   Final   Colony Count >=100,000 COLONIES/ML   Final   Culture YEAST   Final   Report Status 02/28/2013 FINAL   Final  CULTURE, BLOOD (ROUTINE X 2)      Status: None   Collection Time    02/27/13  4:47 PM      Result Value Range Status   Specimen Description BLOOD RIGHT HAND   Final   Special Requests BOTTLES DRAWN AEROBIC ONLY 3[facility]   Final   Culture  Setup Time 02/28/2013 01:12   Final   Culture NO GROWTH 5 DAYS   Final   Report Status 03/06/2013 FINAL   Final  CULTURE, BLOOD (ROUTINE X 2)     Status: None   Collection Time    02/27/13  4:48 PM      Result Value Range Status   Specimen Description BLOOD LEFT HAND   Final   Special Requests BOTTLES DRAWN AEROBIC ONLY [facility]   Final   Culture  Setup Time 02/28/2013 01:11   Final   Culture NO GROWTH 5 DAYS   Final   Report Status 03/06/2013 FINAL   Final    Studies/Results: No results found.   Assessment/Plan: COPD ARF- urinary retention vs colistin Acinetobacter pneumonia, bacteremia Total days of anbx: 13  Colistin/rifampin day 7 of 14  would watch Cr, dosing of colistin and rifampin. Hopefully her Cr will improve with foley and improvement of her urinary retention.    Johny Sax Infectious Diseases [phone] 03/06/2013, 3:56 PM   LOS: 49 days

## 2013-03-07 ENCOUNTER — Other Ambulatory Visit (HOSPITAL_COMMUNITY): Payer: Self-pay

## 2013-03-07 ENCOUNTER — Encounter: Payer: Self-pay | Admitting: Nurse Practitioner

## 2013-03-07 DIAGNOSIS — R7989 Other specified abnormal findings of blood chemistry: Secondary | ICD-10-CM

## 2013-03-07 DIAGNOSIS — A499 Bacterial infection, unspecified: Secondary | ICD-10-CM | POA: Diagnosis not present

## 2013-03-07 DIAGNOSIS — B9689 Other specified bacterial agents as the cause of diseases classified elsewhere: Secondary | ICD-10-CM

## 2013-03-07 DIAGNOSIS — J90 Pleural effusion, not elsewhere classified: Secondary | ICD-10-CM | POA: Diagnosis not present

## 2013-03-07 LAB — TROPONIN I: Troponin I: 2.73 ng/mL (ref <0.30)

## 2013-03-07 LAB — BASIC METABOLIC PANEL
CO2: 23 mEq/L (ref 19–32)
Chloride: 104 mEq/L (ref 96–112)
Sodium: 138 mEq/L (ref 135–145)

## 2013-03-07 LAB — PHOSPHORUS: Phosphorus: 5.5 mg/dL — ABNORMAL HIGH (ref 2.3–4.6)

## 2013-03-07 LAB — MAGNESIUM: Magnesium: 1.5 mg/dL (ref 1.5–2.5)

## 2013-03-07 NOTE — Consult Note (Signed)
CARDIOLOGY CONSULT NOTE  Patient ID: ARTICE HOLOHAN MRN: [id number], DOB/AGE: 1941-04-13   Admit date: 01/02/2013 Date of Consult: 03/07/2013  Primary Physician: No primary provider on file. Primary Cardiologist: new  Pt. Profile  72 y/o female w/o prior cardiac hx whom we've been asked to eval 2/2 elevated troponin's in the setting of chronic VDRF, acute renal failure, and multi-drug resistant Acinetbacter PNA and bacteremia.  Problem List  Past Medical History  Diagnosis Date  . Chronic respiratory failure with hypoxia     a. s/p trach 02/23/2013  . Pneumonia     a. Initially Dx as MRSA pna 12/2012 ->MDR Acinetobacter PNA->VDRF  . COPD (chronic obstructive pulmonary disease)   . Hypertension   . Diabetes mellitus   . Anemia   . Chronic pain   . Esophageal dysmotility   . Chronic pulmonary aspiration     a. s/p PEG 01/26/2013  . Bacteremia     a. 01/2013 Acinetobacter   . Acute renal failure     a. 03/2013 in setting of urinary retention    No past surgical history on file.   Allergies  Not on File  HPI   72 y/o female with the above complex problem list.  She was hospitalized @ [facility] 2/2 pna and resp failure in March 2014 and was subsequently transferred to [facility] for pulm rehab and recovery.  A PEG was placed 3/28, though it was suspected that she cont to take PO's and on 4/10 developed resp compromise requiring intubation.  She was able to be extubated by 4/14, but required reintubation on 4/23 2/2 recurrent decompensation after drinking NGT flush and choking.  She underwent trach on 4/25.  She has been followed by ID throughout her stay @ [facility] and has been treated with colistin and rifampin for MDR Acinetobacter pna and bacteremia.  Since last intubation, she has been making steady, slow recovery.  Since 5/3, creat has been elevating.  It is 3.4 today.  This was initially thought to be 2/2 colistin, though there was also some thought that urinary retention may be playing a  role.  Despite foley placement, creat has continued to climb.  Nephrology is seeing.  We've been asked to evaluate 2/2 elevated troponins, initially drawn last night @ 21:40.  So far, troponins are 1.78->2.73.  Pt denies experiencing chest pain/pressure.  It is not clear after review of chart, why troponin was drawn.  Inpatient Medications  ASA 81mg  daily Biotene TID Colistin 150mg  IV q 8h Cymbalta 30mg  qhs Pepcid 20mg  BID Fentanyl patch 50 mcg q 72h Gabapentin 600mg  QHS Robinul 1mg  q 8h Guaifenesin 200mg  q 8h Ipratropium/Albuterol 3ml TID Lopressor 75mg  BID KCl BID Pravastatin 80mg  QHS Risaquad 1 BID Risperdal 0.5mg  QHS MVI 1 Daily  Family History Family History  Problem Relation Age of Onset  . Parkinson's disease Mother     deceased  . Stroke Father     deceased    Social History History   Social History  . Marital Status: Married    Spouse Name: N/A    Number of Children: N/A  . Years of Education: N/A   Occupational History  . Not on file.   Social History Main Topics  . Smoking status: Former Smoker -- 1.00 packs/day for 40 years  . Smokeless tobacco: Not on file     Comment: quit in 2010  . Alcohol Use: No  . Drug Use: No  . Sexually Active: Not on file   Other  Topics Concern  . Not on file   Social History Narrative   Lives in [place].  Retired.    Review of Systems  General:  No chills, fever, night sweats or weight changes.  Cardiovascular:  No chest pain, dyspnea on exertion, edema, orthopnea, palpitations, paroxysmal nocturnal dyspnea. Dermatological: No rash, lesions/masses Respiratory: No cough, dyspnea Urologic: No hematuria, dysuria Abdominal:   No nausea, vomiting, diarrhea, bright red blood per rectum, melena, or hematemesis Neurologic:  No visual changes, wkns, changes in mental status. All other systems reviewed and are otherwise negative except as noted above.  Physical Exam  Blood pressure 161/72, pulse 98, resp.  rate 22, SpO2 95.00%.  General: Pleasant, NAD.  Intubated but able to answer questions via nodding, mouthing answers. Psych: Normal affect. Neuro: Alert and oriented X 3. Moves all extremities spontaneously. HEENT: Normal  Neck: Supple without bruits or JVD. Lungs:  Resp regular and unlabored, diminished breath sounds bilat. Heart: RRR no s3, s4, 2/6 syst murmur llsb. Abdomen: Soft, non-tender, non-distended, BS + x 4.  Extremities: No clubbing, cyanosis or edema. DP/PT/Radials 2+ and equal bilaterally.  Labs   Recent Labs  03/06/13 2140 03/07/13 0500  TROPONINI 1.78* 2.73*   Lab Results  Component Value Date   WBC 7.9 03/04/2013   HGB 12.8 03/04/2013   HCT 35.4* 03/04/2013   MCV 83.5 03/04/2013   PLT 173 03/04/2013    Recent Labs Lab 03/01/13 0645  03/07/13 0500  NA 132*  < > 138  K 3.7  < > 3.9  CL 96  < > 104  CO2 28  < > 23  BUN 25*  < > 45*  CREATININE 0.84  < > 3.40*  CALCIUM 9.5  < > 8.8  PROT 5.9*  --   --   BILITOT 1.1  --   --   ALKPHOS 101  --   --   ALT 14  --   --   AST 21  --   --   GLUCOSE 101*  < > 104*  < > = values in this interval not displayed.  Radiology/Studies  US Renal  02/26/2013  *RADIOLOGY REPORT*  Clinical Data: Elevated creatinine.  RENAL/URINARY TRACT ULTRASOUND COMPLETE  Comparison:  None.  Findings:  Right Kidney:  11.3 cm.  8 mm x 6 mm x 7 mm cyst is present in the upper polar region of the right kidney. Normal echotexture otherwise.  No hydronephrosis.  Left Kidney:  11.7 cm. Normal echotexture.  Normal central sinus echo complex.  No calculi or hydronephrosis.  Bladder:  Decompressed with Foley catheter.  IMPRESSION: Normal appearance of the kidneys aside from a sub centimeter right upper pole renal cyst.   Original Report Authenticated By: Andreas Newport, M.D.    Dg Chest Port 1 View  03/07/2013  *RADIOLOGY REPORT*  Clinical Data: Evaluate pneumonia, shortness of breath  PORTABLE CHEST - 1 VIEW  Comparison: 03/04/2013; 03/01/2013   Findings: Grossly unchanged cardiac silhouette and mediastinal contours.  Interval increase in now small to moderate-sized left- sided effusion with worsening heterogeneous / consolidative opacities within the left lung.  Right medial basilar heterogeneous opacities are grossly unchanged and favored to represent atelectasis.  Stable position of support apparatus.  No pneumothorax.  Unchanged bones.  IMPRESSION: 1.  Stable positioning of support apparatus.  No pneumothorax. 2.  Worsening now small to moderate-sized left-sided effusion and associated left lung opacities worrisome for progression of infection.   Original Report Authenticated By: Tacey Ruiz,  MD    ECG  RSR, 88, LBBB - not acutely changed.  ASSESSMENT AND PLAN  1.  Elevated Troponin:  No recent h/o chest pain.  ECG with LBBB since admission.  Will eval 2D echo to assess EF and wall motion.  Cont asa, statin, bb.  Will plan conservative therapy for the time being.  Further recs following echo.  2.  VDRF/Acute on chronic respiratory failure/Chronic aspiration/MDR PNA:  Per pulm/ID.  Will assess RV on echo - ? PE as possible cause of troponin elevation.  3.  Acute Renal failure:  Nephrology on board.   Signed, Nicolasa Ducking, NP 03/07/2013, 11:17 AM   Attending Note:   The patient was seen and examined.  Agree with assessment and plan as noted above.  Changes made to the above note as needed.  Ms. Sorenson is a 72 yo with recurrent pneumonia.  She has acute renal insufficiency and had troponin levels drawn for some reason.  No CP.  Exam is as above.  Her elevated troponin levels may be due to lack of clearing by the kidneys.  She has a chronic LBBB and no acute changes.  She is a poor candidate for any invasive or interventional procedure.    Will get an echo to evaluate cardiac function.    Vesta Mixer, Montez Hageman., MD, [facility] 03/07/2013, 12:25 PM

## 2013-03-07 NOTE — Progress Notes (Signed)
PULMONARY  / CRITICAL CARE MEDICINE  Name: Elizabeth Maddox MRN: [id number] DOB: 09/01/1941    ADMISSION DATE:  01/08/2013 CONSULTATION DATE: 4-10  REFERRING MD :  [facility] PRIMARY SERVICE:  [facility]  CHIEF COMPLAINT:  VDRF  BRIEF PATIENT DESCRIPTION:  72 yo female admitted to [facility] from 01/07/13 to 01/17/2013 for respiratory failure from PNA.  Transferred to [facility] for rehab.  Developed recurrent respiratory failure from MDR Acinetobacter PNA requiring intubation 4/10 and PCCM consulted.  PMHx of COPD, Chronic hypoxic respiratory failure, HTN, DM, Anemia, Chronic pain, MRSA PNA February 2014, Esophageal dysmotility  SIGNIFICANT EVENTS / STUDIES:  4/10 pulmonary distress and OTT placed 4/23 - intubated for decreased mental status after episode of drinking NGT flush and choking, ABG relatively good, concern for aspiration & potential c-diff with diarrhea (although PCR negative on 4/10) 4-28 bleeding from trach site  LINES / TUBES: 4/10 ott>>4/14 4/23 OTT >> 4-26 3/28 peg>> Picc ??>> 4-26 trach JY >>  CULTURES: resp 3/25>>  e coli   4/23 BCx2>> acinetobacter  4/23 Resp>>> acinetobacter  4/30 blood >>> negative  ANTIBIOTICS: Per ID  SUBJECTIVE:  Denies chest pain/abdominal pain.  Tolerated 16 hrs of pressure support 5/06.  Noted to have elevated troponins.  VITAL SIGNS: Vital signs reviewed   PHYSICAL EXAMINATION: General: No distress Neuro:  Alert, follows commands HEENT: Trach site clean Cardiovascular: regular Lungs: Decreased breath sounds Lt base, no wheeze Abdomen: PEG site clean Musculoskeletal: No edema Skin: no rashes   Recent Labs Lab 03/05/13 0500 03/06/13 0500 03/07/13 0500  NA 132* 135 138  K 4.0 3.9 3.9  CL 93* 98 104  CO2 26 26 23   BUN 46* 49* 45*  CREATININE 2.70* 3.19* 3.40*  GLUCOSE 151* 129* 104*    Recent Labs Lab 03/01/13 0645 03/02/13 0630 03/04/13 0500  HGB 8.3* 8.3* 12.8  HCT 23.3* 22.9* 35.4*  WBC 9.9 8.8 7.9  PLT 242 277 173   Dg  Chest Port 1 View  03/07/2013  *RADIOLOGY REPORT*  Clinical Data: Evaluate pneumonia, shortness of breath  PORTABLE CHEST - 1 VIEW  Comparison: 03/04/2013; 03/01/2013  Findings: Grossly unchanged cardiac silhouette and mediastinal contours.  Interval increase in now small to moderate-sized left- sided effusion with worsening heterogeneous / consolidative opacities within the left lung.  Right medial basilar heterogeneous opacities are grossly unchanged and favored to represent atelectasis.  Stable position of support apparatus.  No pneumothorax.  Unchanged bones.  IMPRESSION: 1.  Stable positioning of support apparatus.  No pneumothorax. 2.  Worsening now small to moderate-sized left-sided effusion and associated left lung opacities worrisome for progression of infection.   Original Report Authenticated By: Tacey Ruiz, MD     ASSESSMENT / PLAN:  Acute on chronic respiratory failure 2nd to recurrent PNA with hx of COPD, and chronic aspiration Plan: -continue pressure support as tolerated >> defer transition to trach collar until cardiac issues resolved -continue BD's  Lt pleural effusion. Plan: -f/u CXR 5/09 -goal negative fluid balance  -if no improvement, then consider thoracentesis   Sepsis 2nd to MDR Acinetobacter PNA, Bacteremia. Plan: -Abx per ID  Acute renal failure >> likely from urine retention. Plan: -continue foley -renal consulted 5/05  Elevated troponin. Plan: -Cardiology consulted 5/06  PCCM will f/u 5/09 >> please call if help needed sooner  Coralyn Helling, MD [facility] Pulmonary/Critical Care 03/07/2013, 8:32 AM Pager:  [phone] After 3pm call: [phone]

## 2013-03-08 ENCOUNTER — Other Ambulatory Visit (HOSPITAL_COMMUNITY): Payer: Self-pay

## 2013-03-08 LAB — RENAL FUNCTION PANEL
Albumin: 1.9 g/dL — ABNORMAL LOW (ref 3.5–5.2)
BUN: 52 mg/dL — ABNORMAL HIGH (ref 6–23)
Calcium: 8.8 mg/dL (ref 8.4–10.5)
Creatinine, Ser: 3.5 mg/dL — ABNORMAL HIGH (ref 0.50–1.10)
Phosphorus: 9.3 mg/dL — ABNORMAL HIGH (ref 2.3–4.6)
Potassium: 4.6 mEq/L (ref 3.5–5.1)

## 2013-03-08 LAB — CBC WITH DIFFERENTIAL/PLATELET
Basophils Absolute: 0.1 10*3/uL (ref 0.0–0.1)
Basophils Relative: 0 % (ref 0–1)
Eosinophils Absolute: 0.7 10*3/uL (ref 0.0–0.7)
HCT: 18.5 % — ABNORMAL LOW (ref 36.0–46.0)
MCHC: 34.1 g/dL (ref 30.0–36.0)
Monocytes Absolute: 1.7 10*3/uL — ABNORMAL HIGH (ref 0.1–1.0)
Neutro Abs: 17.8 10*3/uL — ABNORMAL HIGH (ref 1.7–7.7)
Neutrophils Relative %: 73 % (ref 43–77)
RDW: 16 % — ABNORMAL HIGH (ref 11.5–15.5)

## 2013-03-08 LAB — PREPARE RBC (CROSSMATCH)

## 2013-03-08 LAB — PRO B NATRIURETIC PEPTIDE: Pro B Natriuretic peptide (BNP): 12269 pg/mL — ABNORMAL HIGH (ref 0–125)

## 2013-03-08 MED FILL — Medication: Qty: 1 | Status: AC

## 2013-03-09 LAB — TYPE AND SCREEN
ABO/RH(D): O POS
Unit division: 0

## 2013-04-01 DEATH — deceased

## 2014-10-24 IMAGING — CR DG CHEST 1V PORT
1 series · 1 of 1 positions shown · non-contrast
Comparison: none

REASON FOR EXAM: SOB
COMMENTS:

[ap]
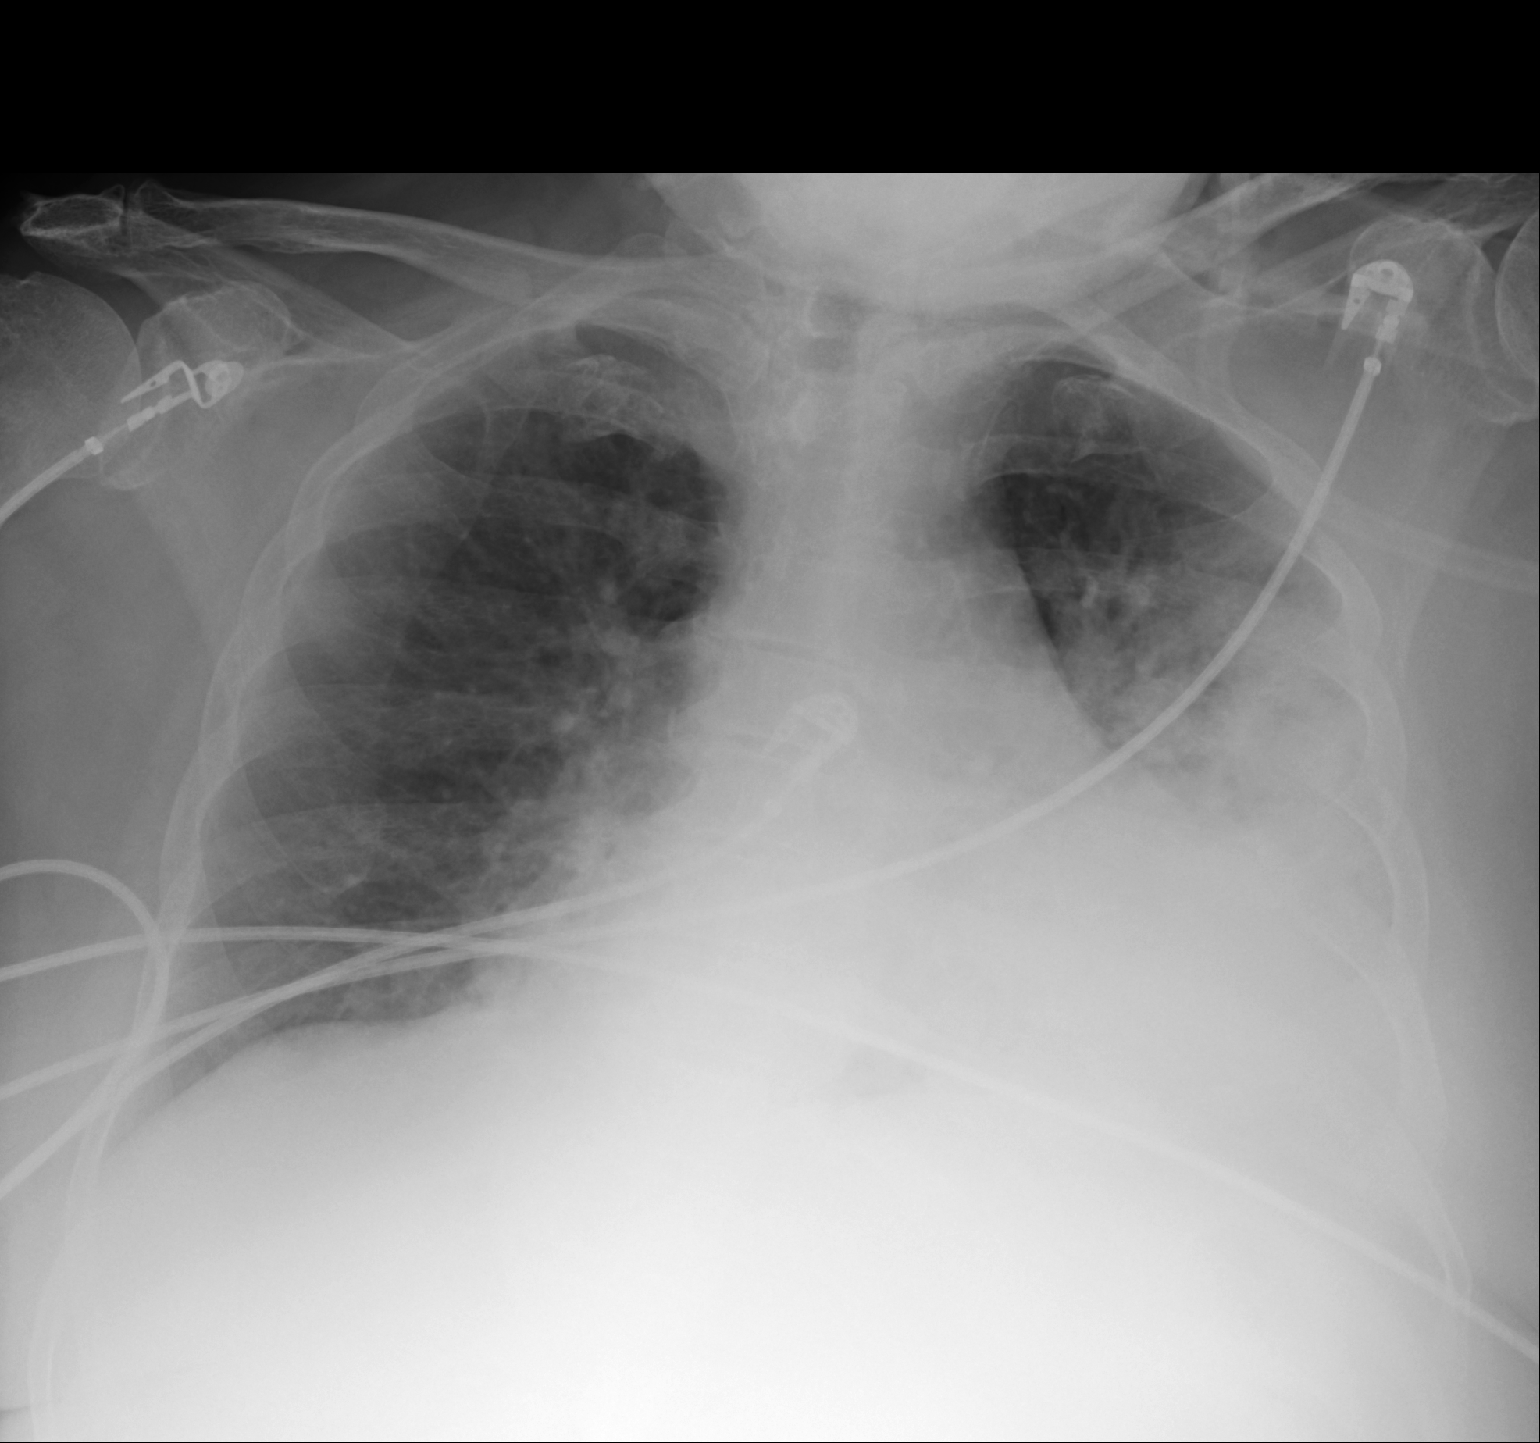

[1 of 1 positions shown; findings below may reference images not displayed]

PROCEDURE:     DXR - DXR PORTABLE CHEST SINGLE VIEW  - January 07, 2013  [DATE]

RESULT:     Comparison is made to the previous examination dated 21 December, 2012. There is increased density in the left hemithorax concerning for left
lower lobe pneumonia possibly with some left upper lobe involvement. The
heart remains enlarged. An accompanying pleural effusion is not excluded.
Followup with PA and lateral views.
IMPRESSION: 1. Findings concerning for left-sided pneumonia. Stable cardiomegaly. The
followup PA and lateral views would be helpful. Followup to document
complete resolution.

[REDACTED]

## 2014-10-25 IMAGING — CR DG CHEST 1V PORT
1 series · 1 of 1 positions shown · non-contrast
Comparison: none

REASON FOR EXAM: pneumonia
COMMENTS:

[ap]
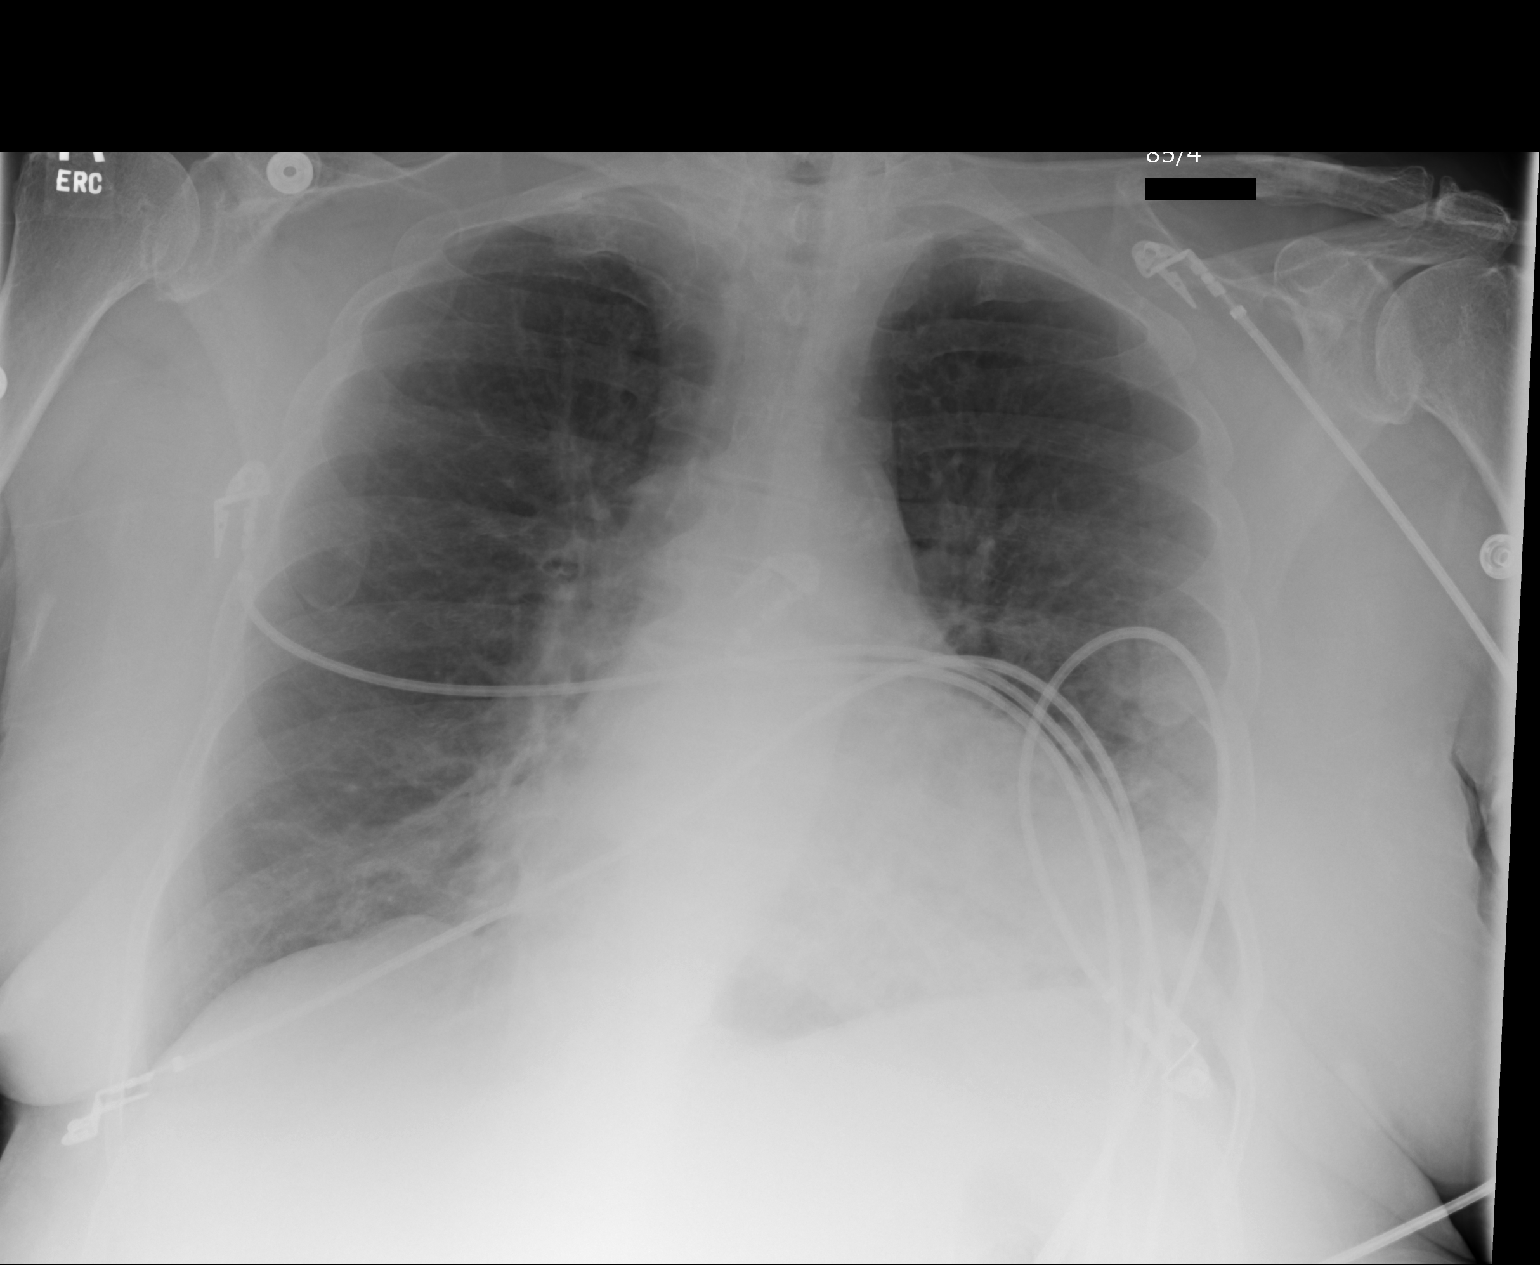

[1 of 1 positions shown; findings below may reference images not displayed]

PROCEDURE:     DXR - DXR PORTABLE CHEST SINGLE VIEW  - January 08, 2013  [DATE]

RESULT:     Comparison is made to the study dated 07 January, 2013.

The cardiac silhouette is enlarged. The projection is lordotic. Patchy
density is seen at the right lung base concerning for minimal atelectasis
versus infiltrate. No significant effusion is seen.
IMPRESSION: Likely right lung base pneumonia. Some right lung base
atelectasis is not excluded. Improved aeration in the left hemithorax
compared to the previous study.

[REDACTED]

## 2014-11-10 IMAGING — CR DG CHEST 1V PORT
1 series · 1 of 1 positions shown · non-contrast
Comparison: 01/22/2013.

CLINICAL DATA: PICC placement.

PORTABLE CHEST - 1 VIEW

[AP]
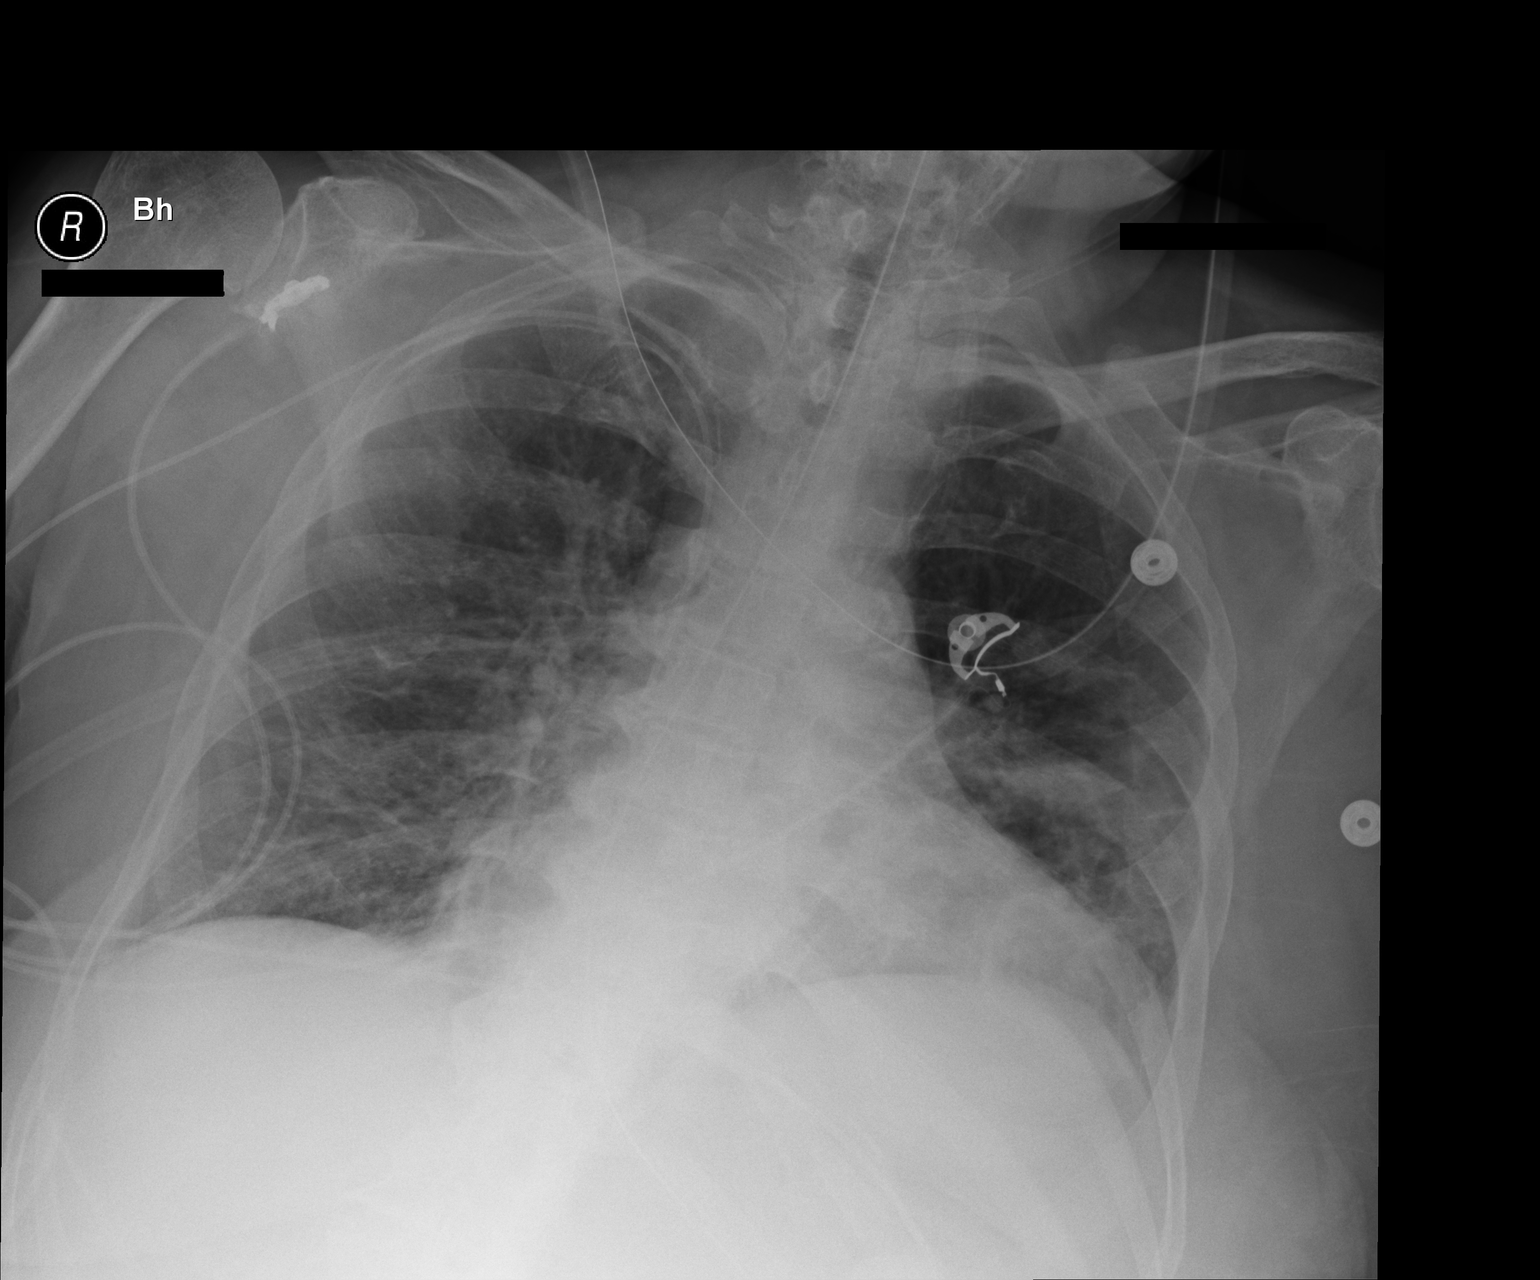

[1 of 1 positions shown; findings below may reference images not displayed]

FINDINGS: Right PICC tip projects over the SVC.  Nasogastric tube
is followed into the stomach with the tip projecting beyond the
inferior boundary of the image.  Lungs are somewhat low in volume
with slight improvement in bibasilar air space disease.  No
definite pleural fluid.
IMPRESSION: Slight improvement in bibasilar air space disease.

## 2014-11-15 IMAGING — CR DG CHEST 1V PORT
2 series · 2 of 2 positions shown · non-contrast
Comparison: 01/24/2013

CLINICAL DATA: Chronic respiratory failure.

PORTABLE CHEST - 1 VIEW

[AP (1 of 2)]
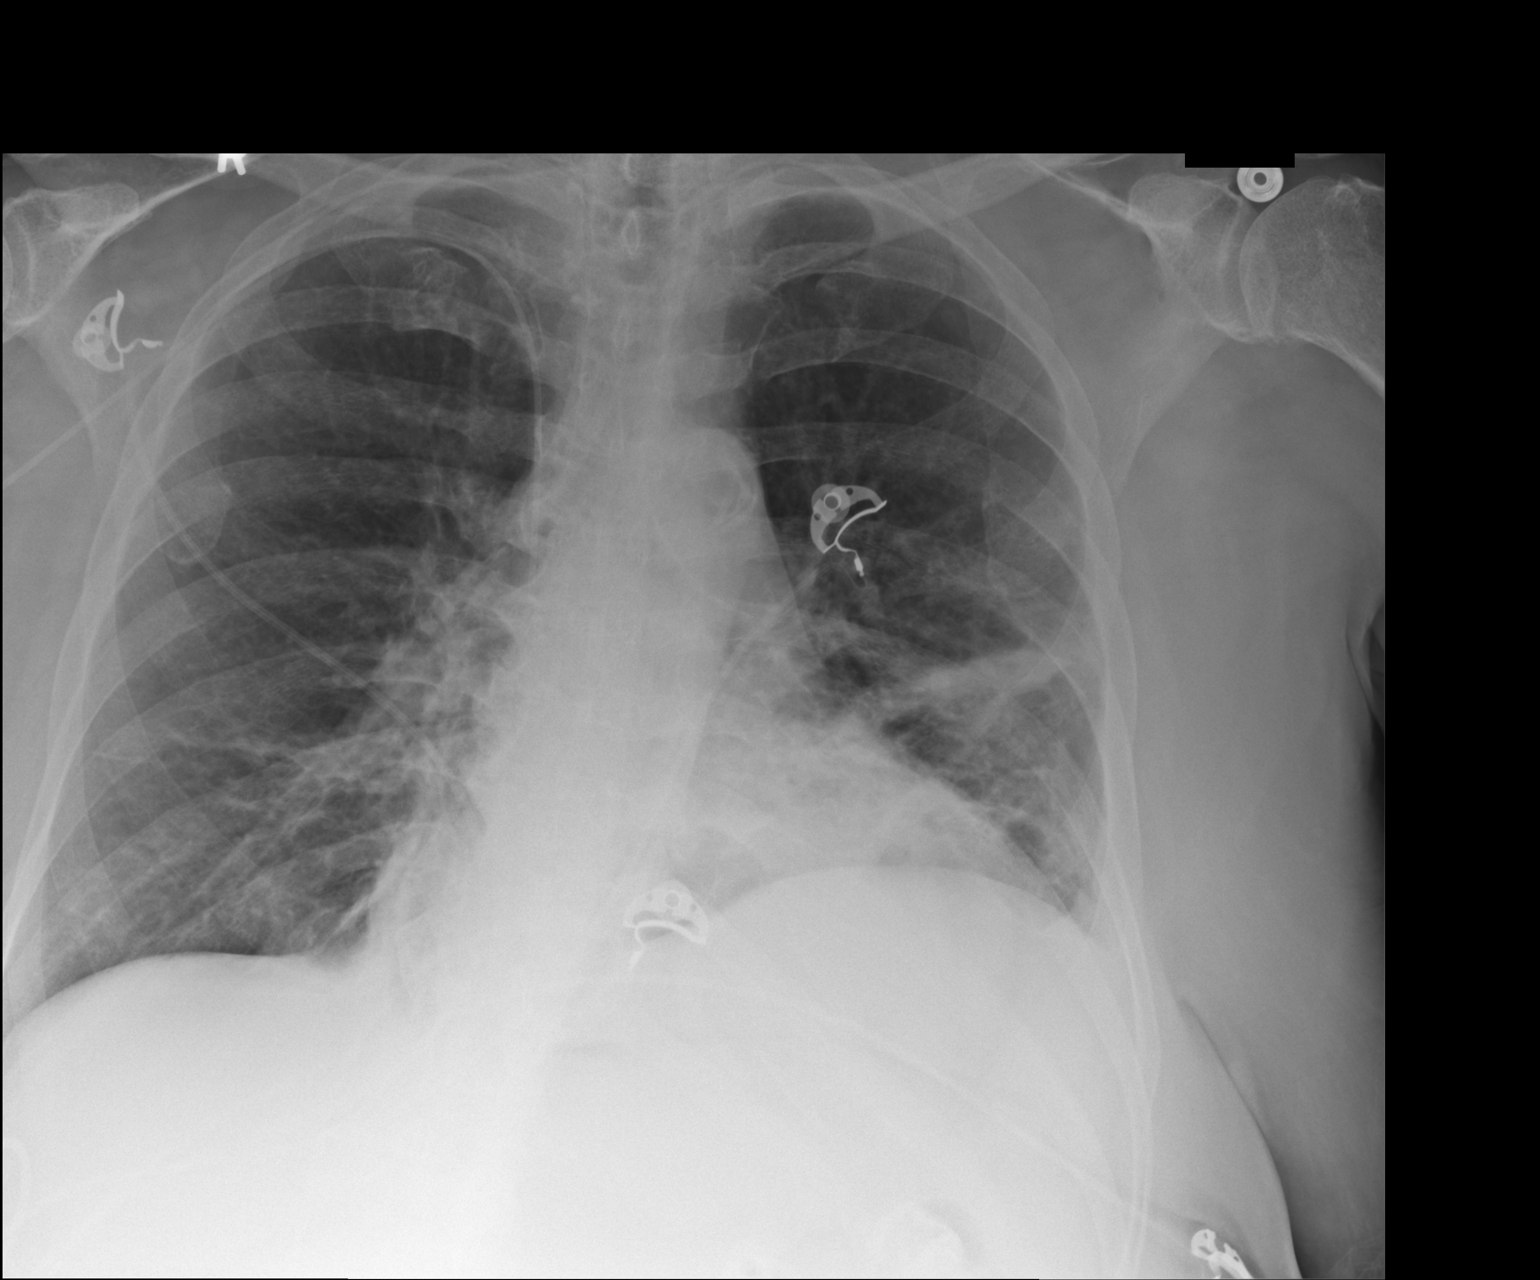

[AP (2 of 2)]
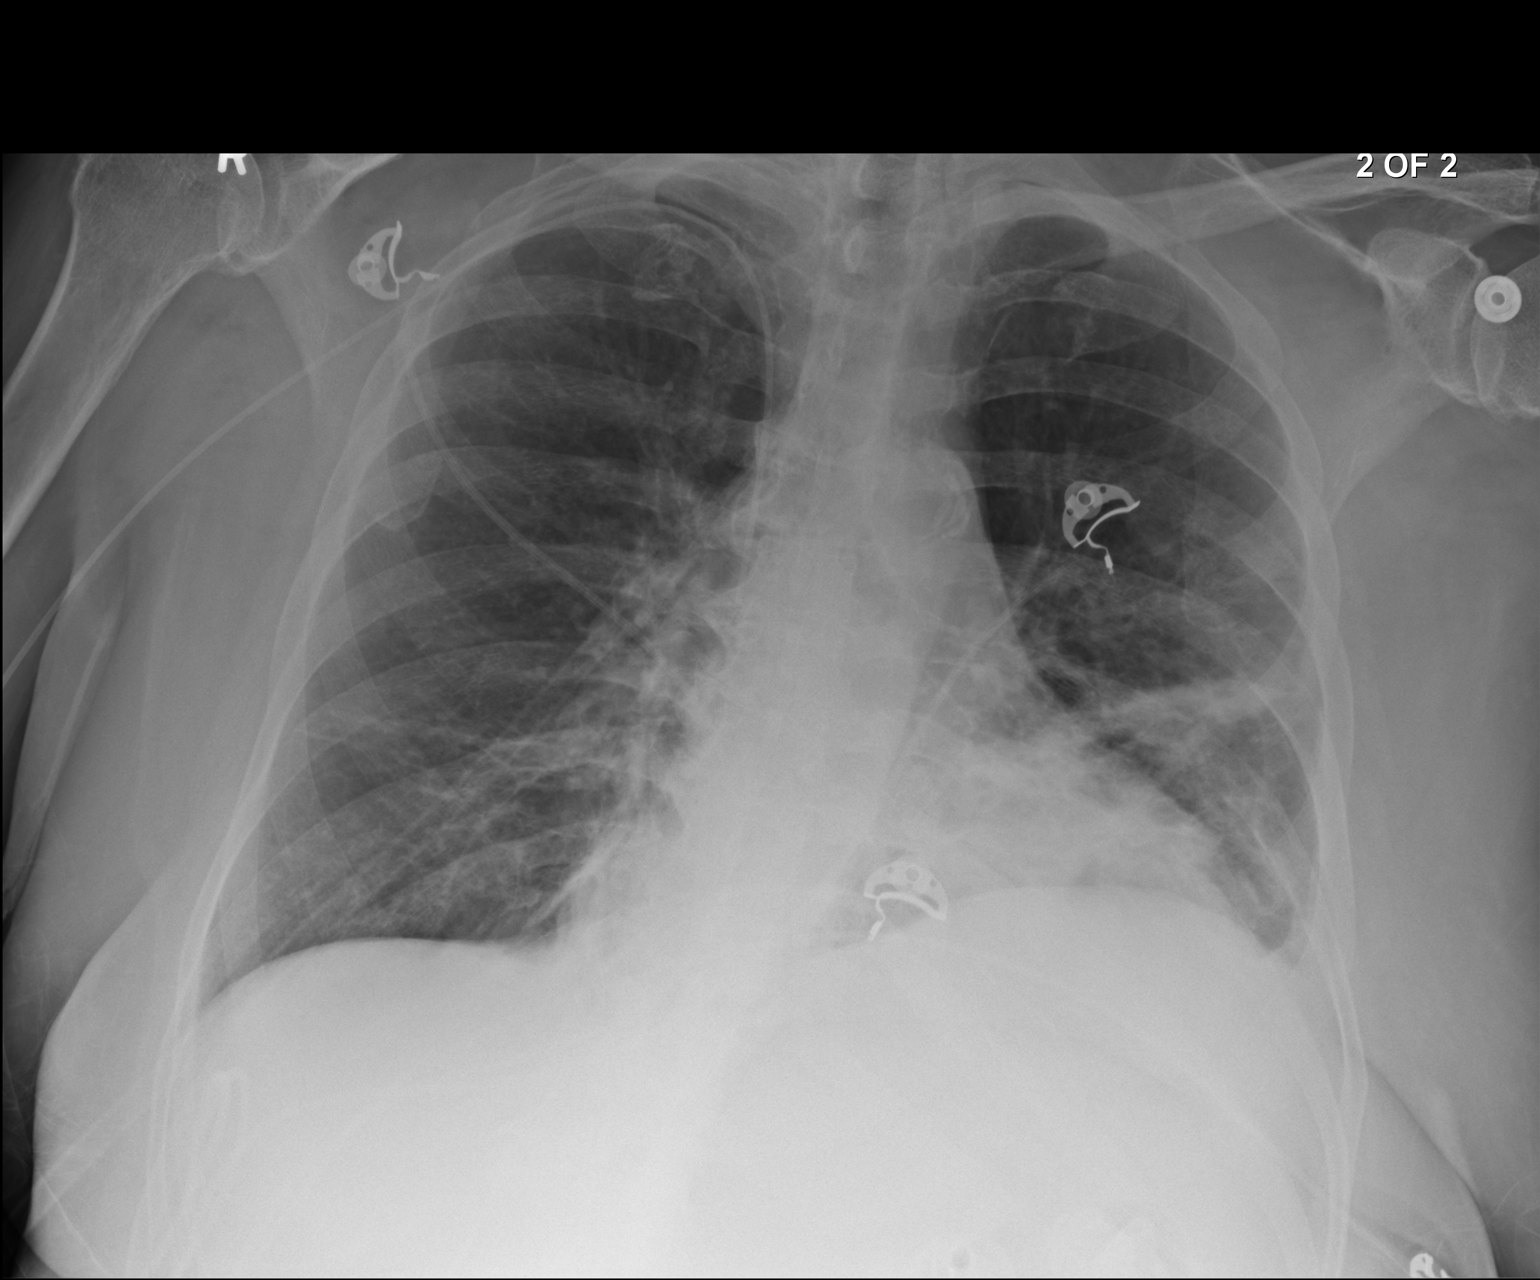

[2 of 2 positions shown; findings below may reference images not displayed]

FINDINGS: Left lower lobe airspace disease shows mild progression.
Mild right lower lobe airspace disease is unchanged.  Negative for
heart failure.

Right arm PICC tip in the SVC.  NG tube has been removed.
IMPRESSION: Mild progression of left lower lobe airspace disease, possibly
pneumonia versus atelectasis.

Mild improvement in aeration right lower lobe.

## 2015-02-21 NOTE — H&P (Signed)
PATIENT NAME:  Elizabeth Maddox, Elizabeth Maddox MR#:  [id number] DATE OF BIRTH:  July 18, 1941  DATE OF ADMISSION:  12/15/2012  PRIMARY CARE PHYSICIAN:  Dr. Dossie Arbour.  REFERRING PHYSICIAN:  Dr. Darnelle Catalan.   CHIEF COMPLAINT:  Shortness of breath and twitching movements.   HISTORY OF PRESENT ILLNESS:  The patient is a 74 year old Caucasian female with a past medical history of chronic COPD, chronic respiratory failure on 3 liters of oxygen, using CPAP machine at bedtime as reported by the ER physician, endocarditis in year 2010, hypertension, chronic low back pain on chronic narcotic use and major depressive disorder, was brought into the ER via EMS for shortness of breath.  The patient was experiencing shortness of breath, wheezing and low-grade fever reported by the EMS to the ER staff and she was brought into the ER.  EMS put her on 4 liters of oxygen and patient was sating 90% by the time she came in.  As patient was working she was placed on nonrebreather, but her pulse oximetry was 83%.  The patient was changed to BiPAP and eventually her pulse oximetry was 93% to 94% on 60% FiO2 using BiPAP.  As patient was experiencing jerking movements of the hands and legs, she was given Ativan in the IV form.  Also, she was given 1 gram of loading dose of fosphenytoin.  Chest x-ray was done which has revealed questionable developing infiltrate in the left lobe.  As patient was short of breath with wheezing she has received Solu-Medrol 125 mg IV and several nebulizer treatments.  ABG was done on 100% nonrebreather which has revealed a pH of 7.30, pCO2 of 67, pO2 78, lactic acid of 0.4 and anion gap of 5.  The patient's sodium is 135, BUN 18, creatinine was 1.59.  Her creatinine back in June of 2013 was 0.8 with a BUN of 11.  During my examination no family members were available and patient was opening her eyes to verbal command, but falling asleep as she just received Ativan for the myoclonic jerks.  According to the ER staff and ER  physician patient was evaluated in [place] ER in January and was diagnosed with flu and she was discharged home with Tamiflu.  According to the ER staff report the patient was experiencing intermittent tonic-clonic jerks in her upper and lower extremities after she started taking Tamiflu since January.  Even in the ER patient was having some tonic-clonic movements of the upper and lower extremities regarding which she has received Ativan in the IV form and fosphenytoin, the loading dose.  As patient was very lethargic, I was unable to get any history from the patient, but patient was not using any accessory muscles during my examination and resting comfortably.   PAST MEDICAL HISTORY:  History of COPD with chronic respiratory failure and lives on 3 liters of oxygen, endocarditis in the year 2010, systemic hypertension, chronic low back pain on chronic narcotic usage, history of major depressive disorder.   PAST SURGICAL HISTORY:  Unobtainable as the patient is lethargic.  ALLERGIES:  The patient is allergic to ACE INHIBITORS, LISINOPRIL, SYMBICORT.   HOME MEDICATIONS:  Aspirin 81 mg once daily, Percocet 10/325 1 to 2 tablets by mouth q. 4 hours as needed, OxyContin 60 mg 3 times a day, multivitamin once daily, metoprolol succinate 100 mg 1 tablet once daily, Lipitor 20 mg at bedtime, gabapentin 300 mg 2 times a day, Combivent 90 mcg 2 puffs inhalation every 4 hours, clonidine 0.1 mg 2 times a day,  amlodipine 5 mg once a day, venlafaxine 75 mg 2 times a day.     SOCIAL HISTORY:  According to old records, patient is married and living with her husband.  Her son has the power of attorney.  Ex-chronic smoker, patient quit smoking in the year 2010, but she has history of 80 pack-year smoking.  According to old records, no history of alcoholism or illicit drug usage.   FAMILY HISTORY:  From old medical records, father died from stroke. Mother died from Parkinson's disease.   REVIEW OF SYSTEMS:   This is  unobtainable as the patient was lethargic.  PHYSICAL EXAMINATION: VITAL SIGNS:  Temperature 99.4, pulse 62, respirations 16, blood pressure is 131/67, sating 94% on BiPAP machine at the setting of 10/5, FiO2 60%, rate 617.  GENERAL APPEARANCE:  Not under acute distress, moderately built and moderately nourished.  HEENT:  Normocephalic, atraumatic.  Pupils are equally reacting to light and accommodation, are 2 to 3 mm in size.  No lip swelling.  Tympanic membranes are intact.  NECK:  Is supple.  No JVD.  LUNGS:  Moderate air entry.  No wheezing.  No accessory muscles.  No anterior chest wall tenderness on palpation.  CARDIAC:  S1, S2 normal.  Regular rate and rhythm, bradycardic.  GASTROINTESTINAL:  Soft.  Bowel sounds are positive in all four quadrants.  Obese, nontender, nondistended.  NEUROLOGIC:  Arousable, but falling asleep as she has received Ativan in the IV form.  EXTREMITIES:  No edema.  No cyanosis.  No clubbing.  SKIN:  No rashes.  No lesions.  Warm to touch.  Normal turgor.  MUSCULOSKELETAL:  No joint effusion or tenderness is noted.    LABORATORY DATA AND IMAGING STUDIES:  Chest x-ray has revealed a possible early developing infiltrate in the left lobe.  Glucose 114, BUN 18, creatinine 1.5, sodium 135, potassium 4.1, chloride 99, CO2 31, GFR 33, anion gap is 5, serum calcium is 9.0.  LFTs are within normal range.  TSH 2.56, troponin T less than 0.72.  WBC 7.5, hemoglobin 13.9, hematocrit of 44.4, platelet count 182.  Urinalysis, leukocyte esterase 1+, nitrate negative, color amber, clarity hazy, glucose negative.  ABGs, pH 7.30, pCO2 67, pO2 is 78, FiO2 100%, bicarb 33, base excess 4.4.  These were done on nonrebreather.  A 12-lead EKG has revealed normal sinus rhythm at 71 beats per minute, PR interval is prolonged at 210, nonspecific intraventricular block.   ASSESSMENT AND PLAN:  The patient is a 74 year old Caucasian female was sent over to the ER via EMS for shortness of breath,  coughing and wheezing.  Will be admitted with the following assessment and plan.  1.  Acute exacerbation of chronic obstructive pulmonary disease with possible early pneumonia.  We will give her Solu-Medrol 60 mg IV q. 6 hours.  DuoNeb treatments q. 6 hours and albuterol q. 2 to 4 hours as needed for shortness of breath.  The patient will be on Levaquin 500 mg IV and pharmacy to dose.  2.  Acute kidney injury.  We will provide her gentle hydration with IV fluids and monitor renal function closely.  3.  Myoclonic jerks/partial seizures since patient started using Tamiflu in January.  Dilantin loading dose was given in the ER.  We will provide her Ativan IV form as needed basis.  Neurology consult is placed.  4.  Sinus bradycardia.  The patient is asymptomatic, holding clonidine, which is her home medication.  5.  Hypertension.  We will continue  home medication metoprolol and amlodipine with close monitoring of the heart rate.  The patient is on telemetry.  6.  Urinary tract infection per urinalysis.  We will obtain urine culture and sensitivity.  The patient is on IV levofloxacin.  Blood cultures were obtained in the ER.   7.  We will provide her gastrointestinal and deep vein thrombosis prophylaxis with ranitidine and heparin subQ.   CODE STATUS:  SHE IS FULL CODE.   TOTAL TIME SPENT:  Is 60 minutes.    ____________________________ Ramonita LabAruna Sharnita Bogucki, MD ag:ea D: 12/15/2012 01:10:34 ET T: 12/15/2012 01:47:35 ET JOB#: [id number]  cc: Ramonita LabAruna Elza Varricchio, MD, <Dictator> Dr. Jaynie Breamrissman Ashe Gago MD ELECTRONICALLY SIGNED 12/19/2012 6:15

## 2015-02-21 NOTE — Discharge Summary (Signed)
PATIENT NAME:  Elizabeth Maddox, Elizabeth Maddox MR#:  [id number] DATE OF BIRTH:  1941/07/15  DATE OF ADMISSION:  12/15/2012 DATE OF DISCHARGE:  12/29/2012    For H and P and previous hospital course, please see the H and P dictated on February 14 by Dr. Amado CoeGouru, as well as interim discharge summary dictated on February 27 by Dr. Luberta MutterKonidena. This summary should cover from the 27th until the 28th of February.   CONSULTANTS: Dr. Malvin JohnsPotter from neurology.   FINAL DIAGNOSES:  1.  Acute on chronic respiratory failure, likely secondary to chronic obstructive pulmonary disease exacerbation and methicillin-resistant Staphylococcus aureus pneumonia.  2.  Acute renal failure.  3.  Myoclonic jerks, likely secondary to pain medications and also respiratory failure.  4.  Hypertension.  5.  Hyponatremia.  6.  Hyperglycemia, likely secondary to steroids, with also new diagnosis of diabetes with elevated hemoglobin A1c of mid 8's.  7.  Obstructive sleep apnea.  8.  Chronic respiratory failure with 3 liters of oxygen via nasal cannula around-the-clock.  9.  History of endocarditis.  10.  Chronic back pain and chronic narcotic use.  11.  Major depressive disorder.   DISCHARGE MEDICATIONS: Metoprolol succinate 100 mg once a day, clonidine 0.1 mg 2 times a day, gabapentin 300 mg 2 caps once a day at bedtime, aspirin 81 mg daily, multivitamin 1 tab daily, oxygen 3 liters continuous, Combivent 18 mcg/90 mcg 2 puffs every 4 hours, Lipitor 20 mg daily, venlafaxine 75 mg 1 tab 2 times a day, albuterol/ipratropium 2.5 mg/3 mL inhaled solution 1 inhale 4 times a day as needed for wheezing, OxyContin extended-release 60 mg 3 times a day, Percocet 10/325 mg 1 tab every 4 hours as needed for pain, metformin 500 mg 1 tab 2 times a day, Risperdal 0.5 mg at bedtime, Spiriva 18 mcg inhaled daily, amlodipine 10 mg daily, linezolid 600 mg 1 tab every 12 hours until the last day (12/31/2012), hydralazine 50 mg 1 tab every 6 hours.   DISPOSITION: To  rehab. Going home on 3 liters nasal cannula.   DIET: Low sodium, ADA diet.   ACTIVITY: As tolerated.   FOLLOWUP: Please follow with PCP and primary neurologist. Check BMP within a week.   HOSPITAL COURSE: Since yesterday, the sodium has improved. Yesterday, sodium was 128 and the patient has been on IV fluids and today, sodium is 131. Furthermore, blood sugars are better. The patient should go home on prednisone taper with 3 days of Zyvox. The patient's shortness of breath has significantly improved. She should follow with neurology for the myoclonic jerks as well.   TOTAL TIME SPENT: 35 minutes.    ____________________________ Krystal EatonShayiq Bruchy Mikel, MD sa:jm D: 12/29/2012 16:14:00 ET T: 12/29/2012 16:58:26 ET JOB#: [id number]  cc: Krystal EatonShayiq Neaveh Belanger, MD, <Dictator> Krystal EatonSHAYIQ Alyssa Rotondo MD ELECTRONICALLY SIGNED 01/08/2013 13:15

## 2015-02-21 NOTE — Discharge Summary (Signed)
PATIENT NAME:  Elizabeth Maddox, Elizabeth Maddox MR#:  [id number] DATE OF BIRTH:  Mar 07, 1941  DATE OF ADMISSION:  01/07/2013 DATE OF DISCHARGE:  18-Jun-2013  DISPOSITION:  Transferred to [facility]-term acute care facility.   ACCEPTING PHYSICIAN:  Dr. Stanton KidneyFortkort   REASON FOR TRANSFER:  Long-term care regarding severe COPD exacerbation with acute respiratory failure.   DISCHARGE/TRANSFER DIAGNOSES: 1.  Acute on chronic respiratory failure.  2.  Multifocal healthcare-acquired bacterial pneumonia.  3.  Acute encephalopathy.  4.  Acute chronic obstructive pulmonary disease exacerbation.  5.  Uncontrolled diabetes mellitus.  6.  Hypokalemia.  7.  Hypomagnesemia.  8.  Sepsis.  9.  Elevated cardiac enzymes secondary to demand ischemia.  10.  Hypertension.  11.  Chronic pain syndrome.  12.  Debility. 13.  Anemia of chronic disease.   CONSULTANTS: Dr. Meredeth IdeFleming of pulmonology, Dr. Juliann Paresallwood of cardiology, and Dr. Harvie JuniorPhifer of palliative care.   IMAGING STUDIES: Include CT scan of the chest without contrast which showed multifocal areas of airspace disease, most prominent in left lower lobe, followed by right lower lobe with minimal involvement in the lingula and right middle lobe.   Chest x-ray done on 18-Jun-2013 shows improved infiltrates on the right lung with persistent perihilar and infrahilar atelectasis.   ADMITTING HISTORY AND PHYSICAL: Please see detailed H and P dictated on 01/07/2013. In brief, this is a 74 year old Caucasian female patient with history of hypertension, hyperlipidemia, COPD and recent treatment of MRSA pneumonia on 02/14 to 02/28 who presented to the hospital with worsening shortness of breath. The patient was placed on BiPAP with multifocal pneumonia on the CAT scan and was admitted to the hospitalist service to CCU.   HOSPITAL COURSE: 1.  Acute on chronic respiratory failure. This was secondary to multifocal bacterial pneumonia. Cultures have been negative. As the patient had MRSA  pneumonia treated recently, she has been covered with broad-spectrum antibiotics and will be completing her antibiotics in 2 days with Zyvox and cefuroxime.  The patient is afebrile. White count is in the normal range.  2.  Acute COPD exacerbation. The patient has been on high-dose IV steroids for multiple days, has been started on oral prednisone on 01/15/2013, which can be tapered over 2 weeks. Repeat chest x-ray done today shows improving infiltrates. The patient continues to be on 5 liters of oxygen while her baseline 3 to 4 liters of oxygen at home.  3.  Acute encephalopathy. The patient did have toxic metabolic encephalopathy at the time of admission which has improved considerably and the patient is back to her baseline.  4.  Uncontrolled diabetes mellitus secondary to steroids. Improving after switching the patient to prednisone. The patient is on metformin and sliding scale insulin which can be continued.  5.  Elevated cardiac enzymes. The patient had elevated troponin with negative CK and MB. Dr. Juliann Paresallwood of cardiology saw the patient, reviewed her history and imaging studies, and suggested this is likely secondary from demand ischemia. Not classified as NSTEMI and the patient does not need any further workup. If she does have any further chest pain, will need an outpatient stress test when she feels better.  6.  Chronic pain syndrome.  Continue pain medications.   On the day of discharge, the patient continues to have some wheezing on chest examination. Temperature is 98.1, pulse of 72, blood pressure 112/73, saturating 97% on 6 liters and is being transferred to LTAC at [facility] for further treatment, physical therapy and breathing treatments.   The patient should  be on around-the-clock nebulizer treatments. Continue Zyvox and cefuroxime for 2 more days after transfer. Continue the flutter valve q. 1 hours. MRSA isolation.   TRANSFER MEDICATIONS:  Please refer to med reconciliation.          Thank you for accepting this transfer and caring for the patient. Please call back with any further questions.   TIME SPENT: Today on this discharge activity was 42 minutes. ____________________________ Molinda Bailiff Lasonja Lakins, MD srs:sb D: 12/30/2012 11:38:46 ET T: 01/01/2013 11:52:59 ET JOB#: [id number]  cc: Wardell Heath R. Kyarah Enamorado, MD, <Dictator> [facility] Orie Fisherman MD ELECTRONICALLY SIGNED 01/18/2013 13:21

## 2015-02-21 NOTE — H&P (Signed)
PATIENT NAME:  Elizabeth Maddox, Elizabeth Maddox#:  [id number] DATE OF BIRTH:  1940-12-22  DATE OF ADMISSION:  01/07/2013  PRIMARY CARE PHYSICIAN: Dr. Vonita MossMark Crissman   CHIEF COMPLAINT: Low oxygen saturations, sent from the nursing home.   HISTORY OF PRESENT ILLNESS: The patient is a 74 year old white female with a past medical history of hypertension, hyperlipidemia, COPD, oxygen dependent on 3 liters of oxygen, who was admitted recently from 12/15/2012 through 12/29/2012 for MRSA pneumonia and COPD exacerbation. The patient was also found to have some myoclonic jerks felt to be from pain medication. However, it was recommended to follow up with Neurology. The patient was discharged to a skilled nursing facility for rehab. Per son, the patient was improving well, and yesterday the patient sounded well, requesting food. However, yesterday evening, patient's son received a call that the patient's oxygen saturations were on the lower side, and she was sent to the Emergency Department. We could not obtain any history from the patient. Per son, there were no symptoms of cough. No information about if the patient was having any fever at the nursing home. On work-up in the Emergency Department, patient was found to be hypoxic, tachypneic. The patient was in severe respiratory distress. The patient was placed on BiPAP. ABG was not obtained prior to BiPAP initiation. A chest x-ray revealed large pneumonia/pleural effusion on the left lower lobe. The patient is quite somnolent. The patient received vancomycin and cefepime in the Emergency Department. The patient was discharged on Zyvox during the last admission.   PAST MEDICAL HISTORY:  1. Chronic respiratory failure on oxygen, oxygen dependent on 3 liters of oxygen.  2. Recent admission for MRSA pneumonia.  3. Hypertension.  4. Hyperlipidemia.  5. Diabetes mellitus, poorly controlled during the last admission secondary to steroids, and also hemoglobin A1c was greater than 8.   6. Obstructive sleep apnea.  7. Chronic back pain, chronic narcotic dependent.  8. Major depressive disorder.  9. COPD.  10. Previous history of endocarditis in 2010.  PAST SURGICAL HISTORY: Could not be obtained from the patient.   ALLERGIES: TO ACE INHIBITORS, LISINOPRIL AND SYMBICORT.   HOME MEDICATIONS:  1. Venlafaxine 75 mg 1 tablet 2 times a day.  2. Tiotropium 18 mcg 1 capsule once a day.  3. Risperidone 0.5 mg 1 tablet once a day. 4. Percocet 10/325, 1 tablet every 4 hours as needed.  5. OxyContin 60 mg 3 times a day.  6. Multivitamin 1 tablet once a day.  7. Metoprolol 100 mg once a day.  8. Toprol-XL.  9. Metformin 500 mg 2 times a day. 10. Lipitor 20 mg once a day.  11. Insulin, on sliding scale insulin.  12. Hydralazine 50 mg every 6 hours.  13. Gabapentin 300 mg 2 capsules once a day.  14. Combivent 18 mcg 2 puffs every 4 hours.  15. Clonidine 0.2 mg 2 times a day.  16. Aspirin 81 mg daily.  17. Amlodipine 10 mg daily.   18. DuoNebs 4 times a day.   SOCIAL HISTORY: Former smoker, 80 pack-year history.  No history of alcohol or drug use.   FAMILY HISTORY: Father died of stroke. Mother died from Parkinson's disease.   REVIEW OF SYSTEMS: Could not be obtained from the patient as the patient is lethargic and quite somnolent.   PHYSICAL EXAMINATION:  GENERAL: A well-built, well-nourished, age-appropriate female on BiPAP, not in distress. VITAL SIGNS: Temperature 102.3, pulse 81, blood pressure 92/54, respiratory rate of 24, oxygen saturations 98% on  BiPAP.  HEENT: Head: Normocephalic, atraumatic. Eyes: No scleral icterus, conjunctivae normal. Pupils are equal and reactive to light. Mucous membranes are dry.  NECK: Supple. No lymphadenopathy. No JVD. No carotid bruits.  CHEST: No focal tenderness. Decreased breath sounds in the left lower lobe.  HEART: S1, S2 regular, tachycardia.  ABDOMEN: Bowel sounds present, soft, slightly distended.  EXTREMITIES: No pedal  edema. Pulses are +0.   NEUROLOGICAL:   The patient is not oriented to place, person and time. No obvious cranial nerve abnormalities.  Could not examine motor and sensory deficits.   LABORATORY AND RADIOLOGICAL DATA:  ABG: pH of 7.46, pCO2 of 39, pO2 of 77.  CBC: WBC of 12.9, hemoglobin 10.6, platelet count of 167.  CMP: Glucose 182, potassium 3.2. The rest of the values are within normal limits.  Troponin 0.12.  Chest x-ray, one view portable, concerning for left-sided pneumonia. Stable cardiomegaly. Recommended to follow up imaging for a complete evaluation.   ASSESSMENT AND PLAN: Ms. Fernando is a 74 year old female who has an extensive history of smoking with chronic respiratory failure, oxygen-dependent, had a recent admission for MRSA pneumonia, comes with a decreased level of consciousness and severe respiratory distress and respiratory failure, large pneumonia on the left side.   1. Pneumonia, bacterial, of unknown organism:  Considering the patient's recent prolonged admission as well as the patient is a resident of a nursing home, we will treat it as a healthcare-associated pneumonia with vancomycin and cefepime. There is also concern about aspiration pneumonia considering the patient's decreased level of consciousness. Once the patient gets better oriented,  may consider getting a swallow evaluation; however, as per son, the patient had no difficulty swallowing.  2. Altered mental status:  A combination of metabolic and toxic encephalopathy from the narcotic medication, yet the patient also has hypoxia. Hold all sedating medications until the patient is well awake.  However, the patient's son is insisting that the patient should be on some level of  narcotic medication. The patient's son is highly concerned about the patient may go into withdrawal.  3. Respiratory failure from pneumonia and possibly chronic obstructive pulmonary disease exacerbation:  Last obtained CT of the chest was  concerning about left lower lobe pneumonia and possibly pleural effusion.  4. Hypokalemia: We will replace by IV.  5. Hypotension secondary to sepsis: We will give 2 liters of IV fluid.  6. Mild elevation of the cardiac enzymes of 0.12: We will also check the CK-MB levels.  7. Hypertension: Currently well-controlled.  We will hold the oral medications until the patient is well oriented.   8. Keep the patient on deep vein thrombosis prophylaxis with Lovenox.   TIME SPENT:  55 minutes reviewing old records, patient care and updating the patient's son.  ____________________________ Susa Griffins, MD pv:cb D: 01/07/2013 08:00:07 ET T: 01/07/2013 08:30:46 ET JOB#: [id number]  cc: Susa Griffins, MD, <Dictator> Steele Sizer, MD Susa Griffins MD ELECTRONICALLY SIGNED 01/09/2013 7:34

## 2015-02-21 NOTE — Consult Note (Signed)
PATIENT NAME:  Elizabeth Maddox, Elizabeth Maddox MR#:  [id number] DATE OF BIRTH:  01/13/1941  DATE OF CONSULTATION:  01/09/2013  REFERRING PHYSICIAN:  Prime Doc. CONSULTING PHYSICIAN:  Dwayne D. Juliann Pares, MD  PRIMARY CARE PHYSICIAN:  Dr. Dossie Arbour.   INDICATION:  Chest pain, elevated troponin with shortness of breath.   HISTORY OF PRESENT ILLNESS:  The patient is a 74 year old female presents with a medical history of hypertension, hyperlipidemia, chronic obstructive pulmonary disease, oxygen dependent hypoxemia on 3 liters, who was admitted recently from February 14 through February 28 for MRSA pneumonia with chronic obstructive pulmonary disease exacerbation.  The patient was found to have monoclonal jerks from pain medications.  She was recommended to follow up with neurology.  The patient was discharged to skilled nursing and rehab.  The patient was doing well, but started having trouble with her foods.  Yesterday she had received a call with oxygen saturations were low.  She went to the Emergency Room.  Could not obtain any history from the patient and her son.  The patient states that she has had symptomatic cough, unable to produce sputum.  No real fever.  In the Emergency Room she was found to be hypoxic, tachypneic, anxious with severe respiratory distress.  She was placed on BiPAP.  ABG was not obtained prior to BiPAP.  Chest x-ray revealed a large pneumonia with pleural effusion on the left side.  The patient became somnolent and had mental status changes.  She received vancomycin and broad-spectrum antibiotics.  She is being discharged on Zosyn and because of her respiratory status she was advised to be admitted for further evaluation and care.  She normally he sees Dr. Meredeth Ide.  Her cardiologist is Dr. Gwen Pounds.   REVIEW OF SYSTEMS:   No blackout spells or syncope.  No nausea or vomiting.  No fever.  No chills.  No sweats.  No weight loss.  No weight gain.  No hemoptysis or hematemesis.  No bright red blood  per rectum.  She has had shortness of breath, congestion, cough, rattling in her chest with hypoxemia.   PAST MEDICAL HISTORY:  Chronic respiratory failure, hypoxemia, oxygen dependent, MRSA pneumonia, hypertension, hyperlipidemia, diabetes, obstructive sleep apnea, chronic back pain, major depression, COPD, endocarditis, anxiety.   PAST SURGICAL HISTORY:  None.   FAMILY HISTORY:  Stroke, Parkinson's.   SOCIAL HISTORY:  She is an 35 pack-year smoker, but recently quit.  Retired.  Disabled.  Cared for by her son, but now in a rest home.   ALLERGIES:  ACE INHIBITORS, LISINOPRIL, SYMBICORT.  MEDICATIONS:  Venlafaxine 75 mg twice a day, Tiotropium 18 mcg 1 capsule daily, risperidone 0.5 once a day, Percocet 10/325 every 4 hours, OxyContin 60 mg 3 times a day, multivitamin once a day, metoprolol 100 mg once a day, metformin 500 mg twice a day, Lipitor 20 a day, insulin sliding scale, hydralazine 50 mg every 6 hours, gabapentin 300 2 tablets once a day, Combivent 18 mcg 2 puffs every 4 hours, clonidine 0.4 twice a day, aspirin 81 mg a day, amlodipine 10 a day, DuoNebs 4 times a day.   PHYSICAL EXAMINATION: VITAL SIGNS:  Blood pressure was 95/60.  Initial temperature was 102.  At the time I saw her, her temperature was down to 99, respiratory rate of 20, in mild respiratory distress.  HEENT:  Normocephalic, atraumatic.  Pupils equal and reactive to light.  NECK:  Supple.  No significant jugular venous distention, bruits or adenopathy.  LUNGS:  Bilateral rhonchi.  Bilateral  rales.  Bilateral crackles.  Dullness in the bases.  Faint expiratory wheezing.  HEART:  Distant.  Regular rhythm.  Systolic ejection murmur at apex, is tachycardic.   ABDOMEN:  Benign.  EXTREMITIES:  Within normal limits.  NEUROLOGIC:  Grossly intact.   SKIN:  Normal.   LABORATORY DATA:  ABG, 7.46, 39, 77%.  White count 12.9, hemoglobin of 10.6, platelet count 167, glucose of 182, potassium 3.2, troponin 0.12.  Chest x-ray shows  left-sided pneumonia with pleural effusion on a portable film.  Poor inspiration.   ASSESSMENT: 1.  Pneumonia.  2.  Pleural effusion.  3.  Chronic obstructive pulmonary disease.  4.  Hypoxemia.  5.  Altered mental status.  6.  Respiratory failure.  7.  Elevated troponin.   8.  Chest pain.  9.  Hypokalemia.  10.  Hypotension.  11.  History of hypertension.  12.  Diabetes.   PLAN:  I agree with admit.  Place in the unit.  Provide significant supportive care, respiratory therapy.  Consider steroid therapy.  Continue broad-spectrum antibiotics.  Pulmonary and critical care consult.  Recommend suction, low dose Lasix if possible.  We will consider pleural thoracentesis tap.  Continue supplemental oxygen.  Continue BiPAP as necessary to help with respiratory status.  We will try to diurese as best we can.  We will treat anxiety with benzodiazepines and antidepressants.  Continue diabetes management and control follow-up fingersticks with short-acting sliding scale.  Try to treat the hypotension as necessary.  Follow up cardiac enzymes.  I do not believe she has had a cardiac event.  This may be secondary to demand ischemia.  Continue statin therapy for hyperlipidemia.  Continue pain management with Percocet and OxyContin.  Consider echocardiogram to evaluate for chest pain, shortness of breath, pneumonia and hypotension.  Do not recommend cardiac cath.  Continue contact and respiratory isolation because of the past history of MRSA.  We will continue to follow patient and treat conservatively from a cardiac standpoint.  I believe this is mostly respiratory.     ____________________________ Bobbie Stackwayne D. Juliann Paresallwood, MD ddc:ea D: 01/10/2013 00:21:20 ET T: 01/10/2013 01:15:34 ET JOB#: [id number]  cc: Dwayne D. Juliann Paresallwood, MD, <Dictator> Alwyn PeaWAYNE D CALLWOOD MD ELECTRONICALLY SIGNED 02/12/2013 7:08

## 2015-02-21 NOTE — Discharge Summary (Signed)
PATIENT NAME:  Elizabeth Maddox, Elizabeth Maddox MR#:  [id number] DATE OF BIRTH:  [birth year]  DATE OF ADMISSION:  12/15/2012 DATE OF DISCHARGE:  12/29/2012   Addendum  The patient will also be discharged on prednisone taper with 30 mg daily for a day, then 20 mg daily for a day, then 10 mg daily for a day, then stop, as well as sliding-scale insulin with aspart.     ____________________________ Krystal EatonShayiq Forest Pruden, MD sa:jm D: 12/29/2012 16:16:58 ET T: 12/29/2012 17:20:29 ET JOB#: [id number]  cc: Krystal EatonShayiq Mekayla Soman, MD, <Dictator> Krystal EatonSHAYIQ Savaya Hakes MD ELECTRONICALLY SIGNED 01/08/2013 13:15

## 2015-02-21 NOTE — Consult Note (Signed)
Referring Physician:  Nicholes Mango :   Primary Care Physician:  Chauncey Cruel, [address] [place], [place] [address], [place] [phone]  Reason for Consult: Admit Date: 15-Dec-2012  Chief Complaint: COPD exacerbation.  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  The patient denies seizures, so her seizure history is gathered from prior notes and speaking with care providers.  Apparently, since receiving tamiflu in January 2014 she has begun to have occasional tonic clonic jerking movemetns in her upper and lower extremities.  Because of this activity which was witnessed in the ED, the patient was given ativan and loaded on fosphenytoin.  No prior seizure history per patient.  When the events occur there is no significant alteration of consiousness.  The symptoms are of moderate severity.  Nothing besides the tamiflu seems to have caused the events, nothing seems to bring them on in particular on a day to day basis.  They were made better by the ativan.  She is tolerating the fosphenytoin fine, no complaints today she says.  It is unclear exactly how often she is having these events. MEDICAL HISTORY:  COPD with chronic respiratory failure on 3 liters of oxygen as an outpatient, endocarditis, hypertension, chronic low back pain on narcotics, depression. SURGICAL HISTORY:  Patient denies. HISTORY:  Married.  Her son has the power of attorney.  Ex smoker, patient quit smoking in 2010, history of 80 pack-year smoking.  No alcohol or drug abuse. HISTORY:  Father died from stroke. Mother died from Parkinson's disease.   Aspirin 81 mg once daily, Percocet 10/325 1 to 2 tablets by mouth q. 4 hours as needed, OxyContin 60 mg 3 times a day, multivitamin once daily, metoprolol succinate  100 mg 1 tablet once daily, Lipitor 20 mg at bedtime, gabapentin 300 mg 2 times a day, Combivent 90 mcg 2 puffs inhalation every 4 hours, clonidine 0.1 mg 2 times a day, amlodipine 5 mg once a day, venlafaxine 75 mg 2 times a day.     ACE INHIBITORS, LISINOPRIL, SYMBICORT.   EXAM   woman, talkative.  NAD.  On oxygen.  Normocephalic and atraumatic. exam shows normal disc size, appearance and C/D ratio without clear evidence of papilledema. and S2 sounds are within normal limits, without murmurs, gallops, or rubs. - Normal- NormalDrift - Absent bilaterally.- Not tested since she needs to be in bed for her O2.    Shoulder abduction (deltoid/supraspinatus, axillary/suprascapular n, C5)   Elbow flexion (biceps brachii, musculoskeletal n, C5-6)   Elbow extension (triceps, radial n, C7)   Finger adduction (interossei, ulnar n, T1)    Hip flexion (iliopsoas, L1/L2)   Knee flexion (hamstrings, sciatic n, L5/S1)   Knee extension (quadriceps, femoral n, L3/4)   Ankle dorsiflexion (tibialis anterior, deep fibular n, L4/5)   Ankle plantarflexion (gastroc, tibial n, S1) STATUS:is oriented to person, place and time.  Recent memory is mildy reduced.  Remote memory is intact.  Attention span and concentration are intact.  Naming, repetition, comprehension and expressive speech are within normal limits.  Patient's fund of knowledge is within normal limits for educational level. NERVES:   CN II (normal visual acuity and visual fields)   CN III, IV, VI (extraocular muscles are intact)   CN V (facial sensation  is intact bilaterally)   CN VII (facial strength is intact bilaterally)   CN VIII (hearing is intact bilaterally)   CN IX/X (palate elevates midline, normal phonation)   CN XI (shoulder shrug strength is normal and symmetric)   CN XII (tongue protrudes midline) to pain and temp bilaterally (spinothalamic tracts)to position and vibration bilaterally (dorsal columns)    Biceps   Brachioradialis    Patellar    Achilles to nose testing is within normal limits.  74 year old woman with a complex medical history including advanced COPD on 3 L of O2 at home presents with a COPD exacerbation.  Neuro is asked to consult regarding seizure like activity.  there appears to be very limited history available to me regarding these events.  It is known that in post marketing studies that seizures were reported in patients using Tamiflu.  There have been no controlled studies to determine if this is a significant association or a few outliers.  In any case, at this point I would need to better characterize or witness the events and correlate with EEG findings to determine if the events are ictal or not.  It is possible that she may be experiencing myoclonic jerks as a result of medication effect from opioids, steroids, and/or albuterol.  Would rec a routine EEG.  Depending on this result, she may require additional outpatient workup with long term monitoring to get a larger sample of the events in question.  In this case, I would not start her on dilantin long term given the uncertainty of these events and no AMS with them if her EEG is unremarkable.  She should not drive or do any activity that requires constant attention for her safety and the safety of others.  It is state law to report her recent abnormal events to the [facility].   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  Hysterectomy - Partial:   Tonsillectomy:   Home  Medications: Medication Instructions Last Modified Date/Time  risperidone 0.$RemoveBefore'5mg'[id number]$  1 tablet at bedtime  14-Feb-14 16:00  metoprolol succinate 100 mg oral tablet, extended release 1 tab(s) orally once a day -IN AM 14-Feb-14 16:00  clonidine 0.1 mg oral tablet 1 tab(s) orally 2 times a day  14-Feb-14 16:00  gabapentin 300 mg oral capsule 2 cap(s) orally once a day (at bedtime)  14-Feb-14 16:00  aspirin 81 mg oral tablet 1 tab(s) orally once a day -last dose 09-29-10 per pt 14-Feb-14 16:00  Multiple Vitamins oral tablet 1 tab(s) orally once a day  14-Feb-14 16:00  02 at 3 liters continuous  14-Feb-14 16:00  Combivent 18 mcg-90 mg/inh inhalation aerosol with adapter 2 puff(s) inhaled every 4 hours 14-Feb-14 16:00  amlodipine 5 mg oral tablet 1 tab(s) orally once a day 14-Feb-14 16:00  Lipitor 20 mg oral tablet 1 tab(s) orally once a day (at bedtime) 14-Feb-14 16:00  Percocet 10/325 oral tablet 1 to 2  tab(s) orally every 4 hours, As Needed- for Pain (max of 8 tabs/day) 14-Feb-14 16:00  OxyContin 60 mg oral tablet, extended release tab(s) orally 3 times a day 14-Feb-14 16:00  venlafaxine 75 mg oral tablet 1 tab(s) orally 2 times a day 14-Feb-14 16:00   [facility] Current Meds:  amLODIPine tablet, ( Norvasc)  5 mg Oral daily  - Indication: Hypertension/ Angina  meTOProlol succinate ER tablet, ( Toprol XL)  100 mg Oral daily  - Indication: Antihypertensive/ Angina  Instructions:  - DO NOT CRUSH  Aspirin Chewable, 81 mg Oral daily  - Indication: Pain/Fever/Thromboembolic Disorders/Post MI/Prophylaxis MI  Gabapentin  capsule, ( Neurontin)  300 mg Oral at bedtime  - Indication: Seizures/ Mood Stabilizer  Acetaminophen-oxyCODONE 325/5 mg tablet, ( Tylox 5-325)  1 to 2 tablet(s) Oral q4h PRN for pain  - Indication: Pain  Instructions:  [Med Admin Window: 30 mins before or after scheduled dose]  HePARin injection, 5000 unit(s), Subcutaneous, q8h  Indication: Anticoagulant, Monitor Anticoags per  hospital protocol  Ondansetron disintegrating tablet, ( Zofran ODT)  4 mg Oral q6h PRN for nausea, vomiting  - Indication: Nausea/ Vomiting  Ranitidine tablet, ( ZanTAC)  150 mg Oral q12h  - Indication: Hyperacidity  methylPREDNISolone injection,  ( Solu-MEDROL injection )  60 mg, IV push, q6h  Indication: Anti-inflammatory Agent/ Immunosuppressant  LORazepam injection,  ( Ativan injection )  2 mg, IV push, q2h PRN for seizure  Indication: Anxiety/ Seizure/ Antiemetic Adjunct/ Preop Sedation  oxyCODONE SR tablet,  ( OxyCONTIN ER)  60 mg Oral q8h  - Indication: Pain  Instructions:  [Med Admin Window: 30 mins before or after scheduled dose]  risperiDONE tablet,  ( RisperDAL)  0.5 mg Oral at bedtime  - Indication: Psychotic Disorders/ Dementia  Venlafaxine XR capsule,  ( Effexor XR)  75 mg Oral bid9/5  - Indication: Depression/ General Anxiety Disorder  Instructions:  DO NOT CRUSH-Take with food  Tiotropium Handihaler 5'S,  ( SPiriVA Handihaler )  1 capsule(s) Inhalation daily  Capsules are for use in inhaler - not for oral use.  Influenza Virus Trivalent Vaccine injection, 0.5 ml, Intramuscular, atdischarge  Indication: Provide Active Immunity to Influenza Strains contained in Vaccine, GIVE ON DISCHARGE DAY.  Levofloxacin injection, ( Levaquin injection )  250 mg, IV Piggyback, q24h, Infuse over 60 minute(s)  Indication: Infection  Allergies:  Ace Inhibitors: Itching  Symbicort: Other  Lisinopril: Other  Vital Signs: **Vital Signs.:   14-Feb-14 03:00  Pulse Pulse 74  Respirations Respirations 19  Systolic BP Systolic BP 163  Diastolic BP (mmHg) Diastolic BP (mmHg) 72  Mean BP 102  Pulse Ox % Pulse Ox % 99  Oxygen Delivery Non-invasive ventilation (CPAP/BIPAP)    06:30  Vital Signs Type Routine  Pulse Pulse 87  Respirations Respirations 19  Systolic BP Systolic BP 140  Diastolic BP (mmHg) Diastolic BP (mmHg) 62  Mean BP 88  Pulse Ox % Pulse Ox % 92  Oxygen  Delivery Non-invasive ventilation (CPAP/BIPAP)    07:48  Pulse Pulse 81  Telemetry pattern Cardiac Rhythm Normal sinus rhythm; Bundle Branch Block; pattern reported by Telemetry Clerk    08:50  Vital Signs Type Routine  Temperature Temperature (F) 99.8  Celsius 37.6  Temperature Source axillary  Pulse Pulse 93  Respirations Respirations 20  Systolic BP Systolic BP 162  Diastolic BP (mmHg) Diastolic BP (mmHg) 92  Mean BP 115  Pulse Ox % Pulse Ox % 91  Pulse Ox Activity Level  At rest  Oxygen Delivery Non-invasive ventilation (CPAP/BIPAP)  Telemetry pattern Cardiac Rhythm Normal sinus rhythm; Bundle Branch Block; pattern reported by Telemetry Clerk    12:59  Vital Signs Type Q 4hr  Temperature Temperature (F) 98.2  Celsius 36.7  Temperature Source oral  Pulse Pulse 74  Respirations Respirations 18  Systolic BP Systolic BP 341  Diastolic BP (mmHg) Diastolic BP (mmHg) 66  Mean BP 88  Pulse Ox % Pulse Ox % 90  Pulse Ox Activity Level  At rest  Oxygen Delivery High Flow Nasal Cannula    17:31  Vital Signs Type Q 4hr  Temperature Temperature (F) 98.4  Celsius 36.8  Temperature Source oral  Pulse Pulse 75  Respirations Respirations 18  Systolic BP Systolic BP 962  Diastolic BP (mmHg) Diastolic BP (mmHg) 76  Mean BP 106  Pulse Ox % Pulse Ox % 93  Pulse Ox Activity Level  At rest  Oxygen Delivery High Flow Nasal Cannula    21:12  Vital Signs Type Q 4hr  Temperature Temperature (F) 98.1  Celsius 36.7  Temperature Source oral  Pulse Pulse 74  Respirations Respirations 18  Systolic BP Systolic BP 229  Diastolic BP (mmHg) Diastolic BP (mmHg) 65  Mean BP 95  Pulse Ox % Pulse Ox % 94  Pulse Ox Activity Level  At rest  Oxygen Delivery High Flow Nasal Cannula  Telemetry pattern Cardiac Rhythm Normal sinus rhythm; Bundle Branch Block; pattern reported by [employer]   Lab Results: Thyroid:  13-Feb-14 21:50   Thyroid Stimulating Hormone 2.56 (0.45-4.50 (International  Unit)  ----------------------- Pregnant patients have  different reference  ranges for TSH:  - - - - - - - - - -  Pregnant, first trimetser:  0.36 - 2.50 uIU/mL)  Hepatic:  13-Feb-14 21:50   Bilirubin, Total 0.8  Alkaline Phosphatase 114  SGPT (ALT) 26  SGOT (AST) 28  Total Protein, Serum 8.0  Albumin, Serum 3.9  Routine Micro:  13-Feb-14 21:50   Micro Text Report BLOOD CULTURE   COMMENT                   NO GROWTH IN 8-12 HOURS   ANTIBIOTIC                       Micro Text Report BLOOD CULTURE   COMMENT                   NO GROWTH IN 8-12 HOURS   ANTIBIOTIC                       Micro Text Report URINE CULTURE   COMMENT                   COLONIES TOO SMALL TO READ   ANTIBIOTIC                       Culture Comment NO GROWTH IN 8-12 HOURS  Result(s) reported on 15 Dec 2012 at 06:33AM.  Culture Comment NO GROWTH IN 8-12 HOURS  Result(s) reported on 15 Dec 2012 at 06:33AM.  Culture Comment COLONIES TOO SMALL TO READ  Result(s) reported on 15 Dec 2012 at 09:13AM.  Specimen Source CLEAN CATCH  Lab:  13-Feb-14 23:34   pH (ABG)  7.30  PCO2  67  PO2  78  FiO2 100  Base Excess  4.4  HCO3  33.0  O2 Saturation 97.2  O2 Device  NON RE-BRE  Specimen Site (ABG) RT RADIAL  Specimen Type (ABG) ARTERIAL  Patient Temp (ABG) 37.0 (Result(s) reported on 14 Dec 2012 at 11:37PM.)  14-Feb-14 04:20   pH (ABG)  7.32  PCO2  59  PO2  57  FiO2 60  Base Excess 2.8  HCO3  30.4  O2 Saturation 91.4  O2 Device BIPAP  Specimen Site (ABG) RT RADIAL  Specimen Type (ABG) ARTERIAL  Patient Temp (ABG) 37.0  PSV 10  CPAP 5.0  Mechanical Rate 10 (Result(s) reported on 15 Dec 2012 at 04:24AM.)  Routine Chem:  13-Feb-14 21:50   Glucose, Serum  114  BUN 18  Creatinine (comp)  1.59  Sodium, Serum  135  Potassium, Serum 4.1  Chloride, Serum 99  CO2, Serum 31  Calcium (Total), Serum 9.0  Anion Gap  5  Osmolality (calc) 273  eGFR (African American)  38  eGFR (Non-African American)   33 (eGFR values <73mL/min/1.73 m2 may be an indication of chronic kidney disease (CKD). Calculated eGFR is useful in patients with stable renal function. The eGFR calculation will not be reliable in acutely ill patients when serum creatinine is changing rapidly. It is not useful in  patients on dialysis. The eGFR calculation may not be applicable to patients at the low and high extremes of body sizes, pregnant women, and vegetarians.)  15-Feb-14 05:01   Glucose, Serum  276  BUN 13  Creatinine (comp) 1.06  Sodium, Serum 138  Potassium, Serum 3.9  Chloride, Serum 101  CO2, Serum 26  Calcium (Total), Serum 9.2  Anion Gap 11  Osmolality (calc) 286  eGFR (African American) >60  eGFR (Non-African American)  53 (eGFR values <64mL/min/1.73 m2 may be an indication of chronic kidney disease (CKD). Calculated eGFR is useful in patients with stable renal function. The eGFR calculation will not be reliable in acutely ill patients when serum creatinine is changing rapidly. It is not useful in  patients on dialysis. The eGFR calculation may not be applicable to patients at the low and high extremes of body sizes, pregnant women, and vegetarians.)  Cardiac:  13-Feb-14 21:50   Troponin I < 0.02 (0.00-0.05 0.05 ng/mL or less: NEGATIVE  Repeat testing in 3-6 hrs  if clinically indicated. >0.05 ng/mL: POTENTIAL  MYOCARDIAL INJURY. Repeat  testing in 3-6 hrs if  clinically indicated. NOTE: An increase or decrease  of 30% or more on serial  testing suggests a  clinically important change)  Routine UA:  13-Feb-14 21:50   Color (UA) Amber  Clarity (UA) Hazy  Glucose (UA) Negative  Bilirubin (UA) Negative  Ketones (UA) Negative  Specific Gravity (UA) 1.020  Blood (UA) Negative  pH (UA) 5.0  Protein (UA) 25 mg/dL  Nitrite (UA) Negative  Leukocyte Esterase (UA) 1+ (Result(s) reported on 14 Dec 2012 at 10:24PM.)  RBC (UA) 1 /HPF  WBC (UA) 16 /HPF  Bacteria (UA) TRACE  Epithelial Cells  (UA) 1 /HPF  Mucous (UA) PRESENT  Hyaline Cast (UA) 24 /LPF (Result(s) reported on 14 Dec 2012 at 10:24PM.)  Routine Hem:  13-Feb-14 21:50   WBC (CBC) 7.5  RBC (CBC) 4.56  Hemoglobin (CBC) 13.9  Hematocrit (CBC) 40.4  Platelet Count (CBC) 182  MCV 89  MCH 30.4  MCHC 34.3  RDW 12.9  Neutrophil % 62.7  Lymphocyte % 18.4  Monocyte % 13.9  Eosinophil % 4.4  Basophil % 0.6  Neutrophil # 4.7  Lymphocyte # 1.4  Monocyte #  1.0  Eosinophil # 0.3  Basophil # 0.0 (Result(s) reported on 14 Dec 2012 at 10:24PM.)  15-Feb-14 05:01   WBC (CBC)  13.9  RBC (CBC) 4.55  Hemoglobin (CBC) 13.8  Hematocrit (CBC) 39.9  Platelet Count (CBC) 201  MCV 88  MCH 30.4  MCHC 34.6  RDW 12.6  Neutrophil % 86.3  Lymphocyte % 10.5  Monocyte % 3.0  Eosinophil % 0.0  Basophil % 0.2  Neutrophil #  12.0  Lymphocyte # 1.5  Monocyte # 0.4  Eosinophil # 0.0  Basophil # 0.0 (Result(s) reported on 16 Dec 2012 at 06:14AM.)   Radiology Results: CT:    14-Feb-14 02:25, CT Head Without Contrast  CT Head Without Contrast   REASON FOR EXAM:    seizure  COMMENTS:       PROCEDURE: CT  - CT HEAD WITHOUT CONTRAST  - Dec 15 2012  2:25AM     RESULT: History: Seizure.    Findings: Standard nonenhanced CT obtained. Comparison made to prior   study 11/12/2012. No mass. No hydrocephalus. No hemorrhage. No acute bony   abnormality.    IMPRESSION:  No acute abnormality.        Verified By: Osa Craver, M.D., MD   Electronic Signatures: Anabel Bene (MD)  (Signed 15-Feb-14 10:16)  Authored: REFERRING PHYSICIAN, Primary Care Physician, Consult, History of Present Illness, Review of Systems, PAST MEDICAL/SURGICAL HISTORY, HOME MEDICATIONS, Current Medications, ALLERGIES, NURSING VITAL SIGNS, LAB RESULTS, RADIOLOGY RESULTS   Last Updated: 15-Feb-14 10:16 by Anabel Bene (MD)

## 2015-02-23 NOTE — Discharge Summary (Signed)
PATIENT NAME:  Elizabeth Maddox, Elizabeth Maddox MR#:  [id number] DATE OF BIRTH:  09-23-1941  DATE OF ADMISSION:  04/02/2012 DATE OF DISCHARGE:  04/03/2012  PRIMARY CARE PHYSICIAN: Vonita MossMark Crissman, MD  ADMISSION DIAGNOSIS: Erythema/redness of right lower extremity.   DISCHARGE DIAGNOSES: 1. Right lower extremity mild cellulitis/erythema, improving.  2. Chronic bilateral lower extremity pain.  3. Hypertension.  4. Chronic respiratory failure, on oxygen.   CONDITION ON DISCHARGE: Fair.   DISCHARGE MEDICATIONS:  1. Risperidone 0.5 mg 1 tablet at bedtime.  2. Metoprolol 100 mg daily.  3. Clonidine 0.1 mg twice a day. 4. Gabapentin 2 capsules orally once a day at bedtime.  5. Aspirin 81 mg p.o. daily.  6. Multivitamin p.o. daily.  7. Oxygen 3 liters continuous.  8. Combivent 2 puffs inhaled every 4 hours. 9. Amlodipine 5 mg daily.  10. Lipitor 20 mg daily.  11. Furosemide 40 mg daily.  12. Percocet 10/325 mg 1 to 2 every four hours as needed. 13. OxyContin 60 mg extended-release one tablet three times daily. 14. Diazepam 5 mg three times daily. 15. Venlafaxine 75 mg twice a day.  16. Keflex 500 mg p.o. twice a day for five more days.   DISCHARGE FOLLOWUP: Followup with Dr. Vonita MossMark Crissman on your scheduled appointment for tomorrow.   DISCHARGE LABS/STUDIES: White count 7.4.   Blood cultures negative in 18 to 24 hours.   Right foot x-ray: No acute abnormality.   Left foot x-ray: No acute abnormality, other than osteoarthritis changes.   Hemoglobin and hematocrit 17.1 and 48.8. Comprehensive metabolic panel within normal limits, except calcium 10.3. Uric acid 5.2.   BRIEF SUMMARY OF HOSPITAL COURSE: Elizabeth Maddox is a [age] year old Caucasian female with multiple medical problems with history of chronic obstructive pulmonary disease oxygen-dependent, chronic pain, hyperlipidemia, and depression who comes to the hospital with right foot pain, redness, and mild swelling. The patient was admitted with:   1. Right lower extremity cellulitis. Legs look much improved already with less redness, no swelling, and pain is improved as well. Blood cultures remained negative. No fever. White count is normal. The patient was empirically on Rocephin and will complete a course of Keflex.  2. Chronic pain. Continued on OxyContin.  3. Hypertension. On metoprolol and clonidine.  4. Hyperlipidemia. The patient was continued on Atorvastatin. 5. Chronic obstructive pulmonary disease. No acute exacerbation. She was continued on her oxygen and current inhalers.  6. Depression. We continued Risperdal and venlafaxine.              The discharge plan was discussed with the patient's son, who is her primary caregiver. The patient will followup with Dr. Dossie Arbourrissman on her scheduled appointment tomorrow. Her hospital stay otherwise remained stable. The patient remained a FULL CODE.   TIME SPENT: 40 minutes. ____________________________ Wylie HailSona A. Allena KatzPatel, MD sap:slb D: 04/03/2012 15:46:02 ET T: 04/04/2012 10:46:03 ET JOB#: [id number]  cc: Assyria Morreale A. Allena KatzPatel, MD, <Dictator> Steele SizerMark A. Crissman, MD Willow OraSONA A Vinton Layson MD ELECTRONICALLY SIGNED 04/08/2012 14:08

## 2015-02-23 NOTE — H&P (Signed)
PATIENT NAME:  Elizabeth Maddox, Elizabeth Maddox MR#:  [id number] DATE OF BIRTH:  Jun 14, 1941  DATE OF ADMISSION:  04/02/2012  PRIMARY CARE PHYSICIAN: Dr. Vonita MossMark Crissman    CHIEF COMPLAINT: Right foot pain.   HISTORY OF PRESENT ILLNESS: Ms. Elizabeth Maddox is a 74 year old Caucasian female with history of recurrent cellulitis. She had recent trimming to her right foot nails, however, in the last 24 hours she had progressive increase in pain and redness of the distal toes of right foot to the extent that she cannot stand on her foot or walk. She came to the Emergency Department for evaluation. Her evaluation was consistent with cellulitis and admitted for IV antibiotics. Patient denies having any fever, no chills.   REVIEW OF SYSTEMS: CONSTITUTIONAL: Denies any fever. No chills. No fatigue. EYES: No blurring of vision. No double vision. ENT: No hearing impairment. No sore throat. No dysphagia. CARDIOVASCULAR: No chest pain. No shortness of breath. No syncope. RESPIRATORY: No cough. No sputum production. No chest pain. She is on oxygen on a chronic basis. GASTROINTESTINAL: No abdominal pain. No vomiting. No diarrhea. GENITOURINARY: No dysuria. No frequency of urination. MUSCULOSKELETAL: She has chronic back pain and spinal stenosis. No joint pain or swelling. No muscular pain or swelling. INTEGUMENT: No skin rash, no ulcers other than the right foot cellulitis. NEUROLOGY: No focal weakness. No seizure activity. No headache. PSYCHIATRY: She has history of depression and anxiety. ENDOCRINE: No polyuria or polydipsia. No heat or cold intolerance.   PAST MEDICAL HISTORY:  1. History of chronic obstructive pulmonary disease, home oxygen dependent on 3 liters.  2. History of endocarditis in 2010. 3. Systemic hypertension. 4. Chronic back pain on chronic narcotic use.  5. History of major depressive disorder.   SOCIAL HISTORY: She is married, living with her husband. She gave the power of attorney to her son who is the caregiver. [place]   SOCIAL HABITS: Ex-chronic smoker. She quit smoking in August 2010. She has history of 80 pack-year smoking. No history of alcoholism or other drug abuse.   FAMILY HISTORY: Her father died from a stroke. Her mother died from Parkinson's disease complications.   ADMISSION MEDICATIONS:  1. Amlodipine 5 mg a day.  2. Aspirin 81 mg a day. 3. Clonidine 0.1 mg twice a day. 4. Combivent inhaler 2 puffs every four hours p.r.n.  5. Diazepam 5 mg 3 times a day as needed.  6. Furosemide 40 mg once a day. 7. Gabapentin 300 mg taking 2 capsules at bedtime.  8. Lipitor 20 mg at bedtime. 9. Metoprolol succinate 100 mg once a day.  10. Multivitamin once a day. 11. Oxygen at 3 liters continuously.  12. Percocet 10/325, 1 q.4 hours p.r.n.  13. Risperidone 0.5 mg at bedtime. 14. Venlafaxine 75 mg 3 times a day.  15. She is also on OxyContin. She is unsure if it is 60 mg. The patient will verify the dose with her son and then we will confirm that.    ALLERGIES: ACE inhibitor causes itching, this includes also lisinopril. Symbicort causing jitteriness feeling.   PHYSICAL EXAMINATION:  VITAL SIGNS: Blood pressure 173/80, respiratory rate 24, pulse 69, temperature 96.2, oxygen saturation 93% on oxygen.   GENERAL APPEARANCE: Elderly lady lying in bed in no acute distress.   HEAD AND NECK EXAMINATION: No pallor. No icterus. No cyanosis.   ENT: Hearing was normal. Nasal mucosa, lips, tongue were normal.   EYES: Normal eyelids and conjunctivae. Pupils are pinpointed. I cannot enlist  reactivity to light.   NECK: Supple. Trachea at midline. No thyromegaly. No cervical lymphadenopathy. No masses.   HEART: Normal S1, S2. No S3, S4. No murmur. No gallop. No carotid bruits.   RESPIRATORY: Normal breathing pattern without use of accessory muscles. No rales. No wheezing.   ABDOMEN: Soft without tenderness. No hepatosplenomegaly. No masses. No hernias.   SKIN: Redness at the  distal right foot with tenderness around the toes. No swelling. There are osteoarthritic changes and bunion formation in both feet.   MUSCULOSKELETAL: There are joint swellings in her feet consistent with osteoarthritis and also bunion formation. No clubbing.   NEUROLOGY: Cranial nerves II through XII are intact. No focal motor deficit.   PSYCHIATRY: Patient is alert, oriented x3. Mood and affect were flat.   LABORATORY, DIAGNOSTIC AND RADIOLOGICAL DATA: CBC showed elevated white blood cell count at 13,000, hemoglobin 17, hematocrit 48, platelet count 236, total protein 9.4, albumin 4.8, bilirubin 1.1, alkaline phosphatase 162, AST 31, ALT 32. Serum glucose 99, BUN 11, creatinine 0.8, sodium 141, potassium 4.6.   ASSESSMENT:  1. Cellulitis of right foot.  2. Systemic hypertension.  3. Chronic obstructive pulmonary disease.  4. Chronic respiratory failure, home oxygen dependent on 3 liters.  5. Chronic back pain.  6. History of endocarditis in 2010. 7. Major depressive disorder.   PLAN: Patient received Zosyn and vancomycin in the Emergency Department. I discontinued both antibiotics and placed her on Rocephin 1 gram daily. The cellulitis does not appear to be extensive. It is localized to the distal right foot. Deep vein thrombosis prophylaxis will be initiated using Lovenox 40 mg sub-Q once a day. I will continue the rest of her home medications as listed above with the exception of OxyContin until it is verified with her son or with her pharmacy in the morning. Meanwhile I will continue Percocet p.r.n. Patient reports to me that she does have LIVING WILL and she gave the power of attorney to her son and he is also the caregiver. I obtained her records from [facility] and I reviewed them.   TIME SPENT EVALUATING THIS PATIENT: Took more than 55 minutes.    ____________________________ [place]. Rudene Re, MD amd:cms D: 04/02/2012 03:38:25 ET T: 04/02/2012 09:44:44 ET JOB#: [id number]  cc: [place]ney [place].  Rudene Re, MD, <Dictator> Steele Sizer, MD Karolee Ohs Dala Dock MD ELECTRONICALLY SIGNED 04/03/2012 6:32
